# Patient Record
Sex: Female | Born: 1970
Health system: Southern US, Community
[De-identification: ages and names within clinical notes are randomized; demographics above are authoritative.]

## PROBLEM LIST (undated history)

## (undated) DIAGNOSIS — J302 Other seasonal allergic rhinitis: Secondary | ICD-10-CM

## (undated) DIAGNOSIS — T7840XA Allergy, unspecified, initial encounter: Secondary | ICD-10-CM

## (undated) DIAGNOSIS — J189 Pneumonia, unspecified organism: Secondary | ICD-10-CM

## (undated) DIAGNOSIS — J45909 Unspecified asthma, uncomplicated: Secondary | ICD-10-CM

## (undated) DIAGNOSIS — G43909 Migraine, unspecified, not intractable, without status migrainosus: Secondary | ICD-10-CM

## (undated) HISTORY — DX: Allergy, unspecified, initial encounter: T78.40XA

## (undated) HISTORY — DX: Other seasonal allergic rhinitis: J30.2

## (undated) HISTORY — DX: Unspecified asthma, uncomplicated: J45.909

## (undated) HISTORY — PX: APPENDECTOMY: SHX54

## (undated) HISTORY — DX: Migraine, unspecified, not intractable, without status migrainosus: G43.909

## (undated) HISTORY — PX: COLONOSCOPY: SHX174

---

## 2006-07-03 ENCOUNTER — Other Ambulatory Visit: Admission: RE | Admit: 2006-07-03 | Discharge: 2006-07-03 | Payer: Self-pay | Admitting: *Deleted

## 2011-04-14 ENCOUNTER — Emergency Department (INDEPENDENT_AMBULATORY_CARE_PROVIDER_SITE_OTHER)
Admission: EM | Admit: 2011-04-14 | Discharge: 2011-04-14 | Disposition: A | Discharge status: Home / Self Care | Payer: No Typology Code available for payment source | Source: Home / Self Care | Attending: Family Medicine | Admitting: Family Medicine

## 2011-04-14 ENCOUNTER — Emergency Department (INDEPENDENT_AMBULATORY_CARE_PROVIDER_SITE_OTHER): Payer: No Typology Code available for payment source

## 2011-04-14 DIAGNOSIS — S92502A Displaced unspecified fracture of left lesser toe(s), initial encounter for closed fracture: Secondary | ICD-10-CM

## 2011-04-14 DIAGNOSIS — S92919A Unspecified fracture of unspecified toe(s), initial encounter for closed fracture: Secondary | ICD-10-CM

## 2011-04-14 DIAGNOSIS — S838X9A Sprain of other specified parts of unspecified knee, initial encounter: Secondary | ICD-10-CM

## 2011-04-14 NOTE — ED Notes (Signed)
Kicked suitcase;  Left 4th toe pain w bruising

## 2011-04-14 NOTE — ED Provider Notes (Signed)
History     CSN: [id number] Arrival date & time: No admission date for patient encounter.   First MD Initiated Contact with Patient 04/14/11 1033      No chief complaint on file.   (Consider location/radiation/quality/duration/timing/severity/associated sxs/prior treatment) Patient is a 40 y.o. female presenting with foot injury.  Foot Injury  The incident occurred yesterday. The incident occurred at home. The injury mechanism was a direct blow. The pain is present in the left foot. The quality of the pain is described as throbbing. The pain is mild. The pain has been constant since onset. She reports no foreign bodies present. The symptoms are aggravated by palpation.    No past medical history on file.  No past surgical history on file.  No family history on file.  History  Substance Use Topics  . Smoking status: Not on file  . Smokeless tobacco: Not on file  . Alcohol Use: Not on file    OB History    No data available      Review of Systems  Musculoskeletal: Positive for gait problem.    Allergies  Review of patient's allergies indicates not on file.  Home Medications  No current outpatient prescriptions on file.  LMP 03/30/2011  Physical Exam  Constitutional: She appears well-developed and well-nourished.  Musculoskeletal: She exhibits edema and tenderness.       Feet:    ED Course  Procedures (including critical care time)  Labs Reviewed - No data to display Dg Foot Complete Left  04/14/2011  *RADIOLOGY REPORT*  Clinical Data: Foot injury 1 day ago.  Pain and lateral foot.  LEFT FOOT - COMPLETE 3+ VIEW  Comparison: None.  Findings: Fracture of the proximal phalanx of the fourth toe extends from the lateral mid shaft to the medial distal metaphysis. The fracture is relatively nondisplaced.  Soft tissue swelling is present lateral to the fifth metatarsophalangeal joint.  No malalignment at the Lisfranc joint is observed.  No metatarsal fracture is  noted.  IMPRESSION:  1.  Nondisplaced fracture of the proximal phalanx of the fourth toe. 2.  Soft tissue swelling lateral to the fifth metatarsal phalangeal joint.  Original Report Authenticated By: Dellia Cloud, M.D.     No diagnosis found.    MDM  X-rays reviewed and report per radiologist.         Barkley Bruns, MD 04/14/11 [id number]

## 2012-02-22 ENCOUNTER — Ambulatory Visit: Payer: No Typology Code available for payment source | Admitting: Family Medicine

## 2012-02-22 ENCOUNTER — Ambulatory Visit: Payer: No Typology Code available for payment source

## 2012-02-22 VITALS — BP 122/60 | HR 70 | Temp 98.4°F | Resp 16 | Ht 67.0 in | Wt 145.8 lb

## 2012-02-22 DIAGNOSIS — I493 Ventricular premature depolarization: Secondary | ICD-10-CM

## 2012-02-22 DIAGNOSIS — I491 Atrial premature depolarization: Secondary | ICD-10-CM

## 2012-02-22 LAB — POCT CBC
Granulocyte percent: 65.5 %G (ref 37–80)
MCV: 91.1 fL (ref 80–97)
MID (cbc): 0.6 (ref 0–0.9)
MPV: 8.8 fL (ref 0–99.8)
Platelet Count, POC: 370 10*3/uL (ref 142–424)
RBC: 4.86 M/uL (ref 4.04–5.48)

## 2012-02-22 LAB — COMPREHENSIVE METABOLIC PANEL
ALT: 13 U/L (ref 0–35)
AST: 16 U/L (ref 0–37)
Albumin: 4.9 g/dL (ref 3.5–5.2)
BUN: 13 mg/dL (ref 6–23)
Calcium: 10.3 mg/dL (ref 8.4–10.5)
Chloride: 103 mEq/L (ref 96–112)
Potassium: 4.3 mEq/L (ref 3.5–5.3)
Sodium: 136 mEq/L (ref 135–145)
Total Protein: 7.3 g/dL (ref 6.0–8.3)

## 2012-02-22 NOTE — Progress Notes (Signed)
Subjective:    Patient ID: Amy Odonnell, female    DOB: Apr 10, 1971, 41 y.o.   MRN: [id number]  HPI  Amy Odonnell is a 41 yr old female with a 2.5 week history of "frequent PVCs".  She believes she is having upwards of 100 per day.  She does not describe chest pain, but describes discomfort in her chest. She notices the irregular beats about mid-morning every day, and they get progressively worse.  Eating seems to make them worse.  She knows that stress makes this worse as well.  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  She is currently working on a PhD in Nurse, mental health at [employer].  She is in her fifth year and is waiting on data.  She is a single mom to a toddler and feels stressed about money.  She describes a similar experience when she was at [employer] doing her master's.  At that time she experienced frequent runs of tachycardia.  She had an echo which she reports revealed trivial TR but was otherwise normal.  She denies any associated symptoms.  She does not consume caffeinated products.  No shortness of breath, diaphoresis, nausea, chest pain, or arm pain.      Review of Systems  Constitutional: Negative for fever, chills, diaphoresis, activity change, appetite change and fatigue.  HENT: Negative.   Respiratory: Negative for cough, chest tightness and shortness of breath.   Cardiovascular: Positive for palpitations. Negative for chest pain and leg swelling.  Gastrointestinal: Negative.   Neurological: Negative for dizziness, syncope, light-headedness and headaches.       Objective:   Physical Exam  Constitutional: She is oriented to person, place, and time. She appears well-developed and well-nourished. No distress.  Cardiovascular: Normal rate, S1 normal, S2 normal and intact distal pulses.   Occasional extrasystoles are present. Exam reveals no gallop and no friction rub.   No murmur heard. Pulmonary/Chest: Effort normal and breath sounds normal. Not tachypneic.   Musculoskeletal: She exhibits no edema.  Neurological: She is alert and oriented to person, place, and time.  Skin: Skin is warm and dry.     Results for orders placed in visit on 02/22/12  POCT CBC      Component Value Range   WBC 9.7  4.6 - 10.2 K/uL   Lymph, poc 2.8  0.6 - 3.4   POC LYMPH PERCENT 28.8  10 - 50 %L   MID (cbc) 0.6  0 - 0.9   POC MID % 5.7  0 - 12 %M   POC Granulocyte 6.4  2 - 6.9   Granulocyte percent 65.5  37 - 80 %G   RBC 4.86  4.04 - 5.48 M/uL   Hemoglobin 14.2  12.2 - 16.2 g/dL   HCT, POC 30.8  65.7 - 47.9 %   MCV 91.1  80 - 97 fL   MCH, POC 29.2  27 - 31.2 pg   MCHC 32.1  31.8 - 35.4 g/dL   RDW, POC 84.6     Platelet Count, POC 370  142 - 424 K/uL   MPV 8.8  0 - 99.8 fL    EKG reviewed with Dr. Katrinka Blazing - frequent PACs    Assessment & Plan:  Amy Odonnell is a 41 yr old female presenting with several weeks of irregular heart beat.  EKG revealed several PACs, these are likely benign and related to stress and fatigue.  I encouraged her to continue avoiding caffeinated foods and beverages.  Encouraged adequate rest and stress reduction as much as possible.  CBC done in house did not reveal any abnormalities.  TSH and CMet are pending, and we will make adjustments based on those results if necessary.  She would like to establish care here and would like a wellness appointment in the next several weeks, at which point we can follow up on her PACs.  If they are still bothersome in the next several weeks, we can consider adding a beta blocker.

## 2012-02-22 NOTE — Patient Instructions (Signed)
Premature Beats A premature beat is an extra heartbeat that happens earlier than normal. Premature beats are called premature atrial contractions (PACs) or premature ventricular contractions (PVCs) depending on the area of the heart where they start. CAUSES  Premature beats may be brought on by a variety of factors including:  Emotional stress.  Lack of sleep.  Caffeine.  Asthma medicines.  Stimulants.  Herbal teas.  Dietary supplements.  Alcohol. In most cases, premature beats are not dangerous and are not a sign of serious heart disease. Most patients evaluated for premature beats have completely normal heart function. Rarely, premature beats may be a sign of more significant heart problems or medical illness. SYMPTOMS  Premature beats may cause palpitations. This means you feel like your heart is skipping a beat or beating harder than usual. Sometimes, slight chest pain occurs with premature beats, lasting only a few seconds. This pain has been described as a "flopping" feeling inside the chest. In many cases, premature beats do not cause any symptoms and they are only detected when an electrocardiography test (EKG) or heart monitoring is performed. DIAGNOSIS  Your caregiver may run some tests to evaluate your heart such as an EKG or echocardiography. You may need to wear a portable heart monitor for several days to record the electrical activity of your heart. Blood testing may also be performed to check your electrolytes and thyroid function. TREATMENT  Premature beats usually go away with rest. If the problem continues, your caregiver will determine a treatment plan for you.  HOME CARE INSTRUCTIONS  Get plenty of rest over the next few days until your symptoms improve.  Avoid coffee, tea, alcohol, and soda (pop, cola).  Do not smoke. SEEK MEDICAL CARE IF:  Your symptoms continue after 1 to 2 days of rest.  You have new symptoms, such as chest pain or trouble  breathing. SEEK IMMEDIATE MEDICAL CARE IF:  You have severe chest pain or abdominal pain.  You have pain that radiates into the neck, arm, or jaw.  You faint or have extreme weakness.  You have shortness of breath.  Your heartbeat races for more than 5 seconds. MAKE SURE YOU:  Understand these instructions.  Will watch your condition.  Will get help right away if you are not doing well or get worse. Document Released: 06/15/2004 Document Revised: 07/31/2011 Document Reviewed: 01/09/2011 ExitCare Patient Information 2013 [employer].  

## 2012-02-29 NOTE — Progress Notes (Signed)
I have reviewed HPI, physical exam, EKG in detail with Katha Hamming, PA-C.  Agree with assessment and plan.  KMS

## 2012-03-01 NOTE — Progress Notes (Signed)
Reviewed and agree.

## 2012-05-10 ENCOUNTER — Encounter: Payer: Self-pay | Admitting: Family Medicine

## 2012-05-10 ENCOUNTER — Ambulatory Visit (INDEPENDENT_AMBULATORY_CARE_PROVIDER_SITE_OTHER): Payer: No Typology Code available for payment source | Admitting: Family Medicine

## 2012-05-10 VITALS — BP 101/63 | HR 66 | Temp 98.5°F | Resp 16 | Ht 67.0 in | Wt 142.0 lb

## 2012-05-10 DIAGNOSIS — Z0000 Encounter for general adult medical examination without abnormal findings: Secondary | ICD-10-CM

## 2012-05-10 LAB — LIPID PANEL: LDL Cholesterol: 87 mg/dL (ref 0–99)

## 2012-05-10 NOTE — Progress Notes (Signed)
Subjective:    Patient ID: Amy Odonnell, female    DOB: 1970/08/10, 41 y.o.   MRN: [id number] Chief Complaint  Patient presents with  . Annual Exam    HPI  Amy Odonnell is a pleasant 41 yo female in for her health maintenance exam.  Amy Odonnell has an adopted 40 yo African-American daughter and is currently in the process of adopting another newborn! She has an adoption physical form today needing completion.  Had 100s of PACs daily for month - see last OV. She later realized that she was really stressed out and as soon as she removed some of the stressors, they completely resolved.    Older aunt with breast cancer but no other significant FMHx.    No h/o abnml pap smears.  Last pap smear was 3 yrs ago - in 2009, 2010, both normal. No sex in the past 5 yrs. Immunizations have to be UTD for work. Had tetanus done in 2004.  fasting  Past Medical History  Diagnosis Date  . Allergy   . Asthma   . Migraine    Past Surgical History  Procedure Date  . Appendectomy    Family History  Problem Relation Age of Onset  . Asthma Mother   . Hypothyroidism Mother   . Diabetes Maternal Grandmother   . Hypertension Maternal Grandmother    History  Substance Use Topics  . Smoking status: Never Smoker   . Smokeless tobacco: Not on file  . 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 No current outpatient prescriptions on file prior to visit.   Allergies  Allergen Reactions  . Codeine Hives   Review of Systems  Constitutional: Negative.   HENT: Negative.   Eyes: Negative.   Respiratory: Negative.   Cardiovascular: Negative.   Gastrointestinal: Negative.   Genitourinary: Negative.   Musculoskeletal: Negative.   Skin: Negative.   Neurological: Negative.    Hematological: Negative.   Psychiatric/Behavioral: Negative.       BP 101/63  Pulse 66  Temp 98.5 F (36.9 C) (Oral)  Resp 16  Ht 5\' 7"  (1.702 m)  Wt 142 lb (64.411 kg)  BMI 22.24 kg/m2  SpO2 99%  LMP 04/22/2012 Objective:   Physical Exam  Constitutional: She is oriented to person, place, and time. She appears well-developed and well-nourished. No distress.  HENT:  Head: Normocephalic and atraumatic.  Right Ear: Tympanic membrane, external ear and ear canal normal.  Left Ear: Tympanic membrane, external ear and ear canal normal.  Nose: Nose normal. No mucosal edema or rhinorrhea.  Mouth/Throat: Uvula is midline, oropharynx is clear and moist and mucous membranes are normal. No posterior oropharyngeal erythema.  Eyes: Conjunctivae normal and EOM are normal. Pupils are equal, round, and reactive to light. Right eye exhibits no discharge. Left eye exhibits no discharge. No scleral icterus.  Neck: Normal range of motion. Neck supple. No thyromegaly present.  Cardiovascular: Normal rate, regular rhythm, normal heart sounds and intact distal pulses.   Pulmonary/Chest: Effort normal and breath sounds normal. No respiratory distress.  Abdominal: Soft. Bowel sounds are normal. There is no tenderness.  Genitourinary: Vagina normal and uterus normal. No breast swelling, tenderness, discharge or bleeding. Cervix exhibits no motion tenderness and no friability. Right adnexum displays no  mass and no tenderness. Left adnexum displays no mass and no tenderness.  Musculoskeletal: She exhibits no edema.  Lymphadenopathy:    She has no cervical adenopathy.  Neurological: She is alert and oriented to person, place, and time. She has normal reflexes.  Skin: Skin is warm and dry. She is not diaphoretic. No erythema.  Psychiatric: She has a normal mood and affect. Her behavior is normal.      Assessment & Plan:   1. Routine general medical examination at a health care facility  Lipid panel, Pap IG  and HPV (high risk) DNA detection  Health Maintenance UTD. No concerns.  Needs TDaP at next OV

## 2012-05-13 LAB — PAP IG AND HPV HIGH-RISK: HPV DNA High Risk: NOT DETECTED

## 2012-07-06 ENCOUNTER — Other Ambulatory Visit: Payer: Self-pay

## 2012-07-12 ENCOUNTER — Encounter: Payer: Self-pay | Admitting: Family Medicine

## 2012-07-12 ENCOUNTER — Ambulatory Visit (INDEPENDENT_AMBULATORY_CARE_PROVIDER_SITE_OTHER): Payer: No Typology Code available for payment source | Admitting: Family Medicine

## 2012-07-12 VITALS — BP 94/68 | HR 62 | Temp 98.1°F | Resp 16 | Ht 67.0 in | Wt 145.5 lb

## 2012-07-12 DIAGNOSIS — B009 Herpesviral infection, unspecified: Secondary | ICD-10-CM

## 2012-07-12 MED ORDER — VALACYCLOVIR HCL 500 MG PO TABS: 500.0000 mg | ORAL_TABLET | Freq: Two times a day (BID) | ORAL | Status: DC

## 2012-07-12 MED ORDER — DROSPIRENONE-ETHINYL ESTRADIOL 3-0.02 MG PO TABS: 1.0000 | ORAL_TABLET | Freq: Every day | ORAL | Status: DC

## 2012-07-12 MED ORDER — DROSPIRENONE-ETHINYL ESTRADIOL 3-0.03 MG PO TABS: 1.0000 | ORAL_TABLET | Freq: Every day | ORAL | Status: DC

## 2012-07-12 NOTE — Progress Notes (Signed)
Subjective:    Patient ID: Amy Odonnell, female    DOB: Apr 12, 1971, 42 y.o.   MRN: [id number]  HPI  Amy Odonnell is doing very well. She is hoping to adopt a second child sometime in the next 4 to 8 mos. She has discussed her desire to breast feed with a lactation consultant who has had excellent experiencing achieving this goal in women who did not carry the pregnancy by placing people high OCPs with a high component of progesterone for about 5 mos and starting on a galactogue in addition like reglan.  Unfortunately, reglan - which pt prev tried during her first addoption made her very fatigued so she has started on otc domperadone. Had a 1 mo supply of Yasmin and is on the 3rd wk of it without any side effects.  She is aware of the increased DVT/PE risk with this med and is very active.  She is enjoying the benefit of being amenorrheic and not having cramping and painful periods. Her 42 yo Amy Odonnell is doing well. Past Medical History  Diagnosis Date  . Allergy   . Asthma   . Migraine    No current outpatient prescriptions on file prior to visit.   No current facility-administered medications on file prior to visit.   Allergies  Allergen Reactions  . Codeine Hives   Review of Systems  Constitutional: Negative for fever, chills, diaphoresis, activity change, appetite change, fatigue and unexpected weight change.  Respiratory: Negative for cough and shortness of breath.   Cardiovascular: Negative for chest pain, palpitations and leg swelling.  Genitourinary: Positive for menstrual problem. Negative for dysuria, flank pain, vaginal bleeding, vaginal discharge, enuresis, difficulty urinating and genital sores.  Musculoskeletal: Negative for myalgias, joint swelling and gait problem.  Neurological: Negative for dizziness, syncope, weakness, light-headedness and numbness.      BP 94/68  Pulse 62  Temp(Src) 98.1 F (36.7 C) (Oral)  Resp 16  Ht 5\' 7"  (1.702 m)  Wt 145 lb 8 oz (65.998 kg)  BMI  22.78 kg/m2  SpO2 100%  LMP 06/10/2012 Objective:   Physical Exam  Constitutional: She is oriented to person, place, and time. She appears well-developed and well-nourished. No distress.  HENT:  Head: Normocephalic and atraumatic.  Right Ear: External ear normal.  Eyes: Conjunctivae are normal. No scleral icterus.  Pulmonary/Chest: Effort normal.  Neurological: She is alert and oriented to person, place, and time.  Skin: Skin is warm and dry. She is not diaphoretic. No erythema.  Psychiatric: She has a normal mood and affect. Her behavior is normal.          Assessment & Plan:  Dysmenorrhea - start back-to-back pill packs of yasmin - skip placebo pills so med is continuous. Minimize other dvt risk factors.  Herpes labialis - Plan: valACYclovir (VALTREX) 500 MG tablet - takes as needed for rare breakouts but like to keep at home so can take immed if needed.  Need for Tdap vaccination - Plan: Tdap vaccine greater than or equal to 7yo IM  Meds ordered this encounter  Medications                        . valACYclovir (VALTREX) 500 MG tablet    Sig: Take 1 tablet (500 mg total) by mouth 2 (two) times daily.    Dispense:  60 tablet    Refill:  5  . drospirenone-ethinyl estradiol (YASMIN 28) 3-0.03 MG tablet    Sig: Take 1 tablet by  mouth daily. Skip placebo pills. Start a new pill pack every 21 days so you are only taking active pills.    Dispense:  3 Package    Refill:  3    D/c prev order for [employer]

## 2013-03-27 ENCOUNTER — Other Ambulatory Visit: Payer: Self-pay

## 2013-05-27 ENCOUNTER — Encounter: Payer: No Typology Code available for payment source | Admitting: Family Medicine

## 2013-06-27 ENCOUNTER — Encounter: Payer: Self-pay | Admitting: Family Medicine

## 2013-06-27 ENCOUNTER — Ambulatory Visit (INDEPENDENT_AMBULATORY_CARE_PROVIDER_SITE_OTHER): Payer: BC Managed Care – PPO | Admitting: Family Medicine

## 2013-06-27 VITALS — BP 116/66 | HR 73 | Temp 98.7°F | Resp 16 | Ht 67.0 in | Wt 145.8 lb

## 2013-06-27 DIAGNOSIS — Z0000 Encounter for general adult medical examination without abnormal findings: Secondary | ICD-10-CM

## 2013-06-27 DIAGNOSIS — D239 Other benign neoplasm of skin, unspecified: Secondary | ICD-10-CM

## 2013-06-27 DIAGNOSIS — D229 Melanocytic nevi, unspecified: Secondary | ICD-10-CM

## 2013-06-27 DIAGNOSIS — L989 Disorder of the skin and subcutaneous tissue, unspecified: Secondary | ICD-10-CM

## 2013-06-27 NOTE — Progress Notes (Signed)
Chief Complaint:  Chief Complaint  Patient presents with  . Annual Exam    HPI: Amy Odonnell is a 43 y.o. 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  I am not going to argue with her.  She wants 2 lesions to look at  She has 1 adopted child names Amy Odonnell and is in the process of gadopting another child   Past Medical History  Diagnosis Date  . Allergy   . Asthma   . Migraine    Past Surgical History  Procedure Laterality Date  . Appendectomy     History   Social History  . Marital Status: Unknown    Spouse Name: N/A    Number of Children: N/A  . Years of Education: N/A   Social History Main Topics  . Smoking status: Never Smoker   . Smokeless tobacco: None  . Alcohol Use: No  . Drug Use: No  . Sexual Activity: Yes   Other Topics Concern  . None   Social History Narrative  . None   Family History  Problem Relation Age of Onset  . Asthma Mother   . Hypothyroidism Mother   . Diabetes Maternal Grandmother   . Hypertension Maternal Grandmother   . Hyperlipidemia Father    Allergies  Allergen Reactions  . Codeine Hives   Prior to Admission medications   Medication Sig Start Date End Date Taking? Authorizing Provider  ALBUTEROL IN Inhale into the lungs as needed.   Yes Historical Provider, MD  valACYclovir (VALTREX) 500 MG tablet Take 1 tablet (500 mg total) by mouth 2 (two) times daily. 07/12/12  Yes Shawnee Knapp, MD  drospirenone-ethinyl estradiol (YASMIN 28) 3-0.03 MG tablet Take 1 tablet by mouth daily. Skip placebo pills. Start a new pill pack every 21 days so you are only  taking active pills. 07/12/12   Shawnee Knapp, MD     ROS: The patient denies fevers, chills, night sweats, unintentional weight loss, chest pain, palpitations, wheezing, dyspnea on exertion, nausea, vomiting, abdominal pain, dysuria, hematuria, melena, numbness, weakness, or tingling.   All other systems have been reviewed and were otherwise negative with the exception of those mentioned in the HPI and as above.    PHYSICAL EXAM: Filed Vitals:   06/27/13 1021  BP: 116/66  Pulse: 73  Temp: 98.7 F (37.1 C)  Resp: 16   Filed Vitals:   06/27/13 1021  Height: 5\' 7"  (1.702 m)  Weight: 145 lb 12.8 oz (66.134 kg)   Body mass index is 22.83 kg/(m^2).  General: Alert, no acute distress HEENT:  Normocephalic, atraumatic, oropharynx patent. EOMI, PERRLA Cardiovascular:  Regular rate and rhythm, no rubs murmurs or gallops.  No Carotid bruits, radial pulse intact. No pedal edema.  Respiratory: Clear to auscultation bilaterally.  No wheezes, rales, or rhonchi.  No cyanosis, no use of accessory musculature GI: No organomegaly, abdomen is soft and non-tender, positive bowel sounds.  No masses. Skin: + nevus, benign in appearance with SK  Neurologic: Facial musculature symmetric. Psychiatric: Patient is appropriate  throughout our interaction. Lymphatic: No cervical lymphadenopathy Musculoskeletal: Gait intact. Breast exam normal, no sxs Declined pelvic exam Not sexually exam   LABS: Results for orders placed in visit on 05/10/12  LIPID PANEL      Result Value Range   Cholesterol 165  0 - 200 mg/dL   Triglycerides 44  <150 mg/dL   HDL 69  >39 mg/dL   Total CHOL/HDL Ratio 2.4     VLDL 9  0 - 40 mg/dL   LDL Cholesterol 87  0 - 99 mg/dL  PAP IG AND HPV HIGH-RISK      Result Value Range   HPV DNA High Risk NOT DETECTED     Specimen adequacy:       FINAL DIAGNOSIS:       COMMENTS:       Cytotechnologist:         EKG/XRAY:   Primary read interpreted by Dr. Marin Comment at  [facility].   ASSESSMENT/PLAN: Encounter Diagnoses  Name Primary?  . Annual physical exam Yes  . Benign skin lesion   . Nevus    Verbal consent obtained, 2 ml of 1% lidocaine with epi used and bedadine and alcohol and ethylchloride used for local sedation and cleaning of area. 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    Gross sideeffects, risk and benefits, and alternatives of medications d/w patient. Patient is aware that all medications have potential sideeffects and we are unable to predict every sideeffect or drug-drug interaction that may occur.  LE, [place], DO 06/27/2013 11:08 AM

## 2013-09-05 ENCOUNTER — Encounter: Payer: Self-pay | Admitting: Family Medicine

## 2013-09-05 ENCOUNTER — Ambulatory Visit (INDEPENDENT_AMBULATORY_CARE_PROVIDER_SITE_OTHER): Payer: BC Managed Care – PPO | Admitting: Family Medicine

## 2013-09-05 VITALS — BP 105/66 | HR 74 | Temp 98.3°F | Resp 16 | Ht 67.25 in | Wt 146.0 lb

## 2013-09-05 DIAGNOSIS — F419 Anxiety disorder, unspecified: Secondary | ICD-10-CM

## 2013-09-05 DIAGNOSIS — F5105 Insomnia due to other mental disorder: Principal | ICD-10-CM

## 2013-09-05 DIAGNOSIS — F411 Generalized anxiety disorder: Secondary | ICD-10-CM

## 2013-09-05 DIAGNOSIS — F489 Nonpsychotic mental disorder, unspecified: Secondary | ICD-10-CM

## 2013-09-05 MED ORDER — TRAZODONE HCL 50 MG PO TABS: 50.0000 mg | ORAL_TABLET | Freq: Every day | ORAL | Status: DC

## 2013-09-05 NOTE — Patient Instructions (Signed)

## 2013-09-06 DIAGNOSIS — Z3041 Encounter for surveillance of contraceptive pills: Secondary | ICD-10-CM | POA: Insufficient documentation

## 2013-09-06 NOTE — Progress Notes (Signed)
S:  This 43 y.o. [place] female has insomnia >> 12 months duration. SHe adopted an infant girl 4 years ago; her sleep pattern never returned to normal even after the child began to sleep through the night. She is now in the process of adopting another child; the birth mother is in her 75th week of the pregnancy. The pregnancy is going well but the pt has concerns because of the birth mother's hx of substance abuse. 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 She works at [employer] and the physicians she works w/ have recommended she try Trazodone. OTC melatonin ineffective; Benadryl had side effects. She would like to use a prescribed medication just until she has new infant in the home and everything has settled down a little. She is certain she does not want to use Ambien because she has a young child in the home.  PMHx, Surg Hx, Soc and Fam Hx reviewed.  ROS; Noncontributory.  O: Filed Vitals:   09/05/13 1614  BP: 105/66  Pulse: 74  Temp: 98.3 F (36.8 C)  Resp: 16   GEN: In NAD: WN,WD. HENT: [place]/AT; EOMI w/ clear conj/sclerae. Otherwise unremarkable. COR: RRR. LUNGS: Unlabored resp. SKIN: W&D; intact w/o diaphoresis, erythema, jaundice or pallor. EXT: MAEs; no c/c/e. NEURO: A&O x 3; CNs intact. Nonfocal. PSYCH: Pleasant and calm. Conversant and articulate. Speech pattern and thought content normal. Judgement sound.  A/P: Insomnia secondary to anxiety- Advised Chamomile tea at bedtime and establish a regular bedtime routine. Trial Trazodone 50 mg 1/2 tablet hs. If not sleeping through the night, increase to 1 tablet.  Meds ordered this encounter  Medications  . traZODone (DESYREL) 50 MG tablet    Sig: Take 1 tablet (50 mg total) by mouth at bedtime.    Dispense:  30 tablet    Refill:  3

## 2015-08-23 ENCOUNTER — Ambulatory Visit (INDEPENDENT_AMBULATORY_CARE_PROVIDER_SITE_OTHER): Payer: 59 | Admitting: Urgent Care

## 2015-08-23 VITALS — BP 122/74 | HR 77 | Temp 98.0°F | Resp 16 | Ht 67.5 in | Wt 154.0 lb

## 2015-08-23 DIAGNOSIS — J189 Pneumonia, unspecified organism: Secondary | ICD-10-CM

## 2015-08-23 DIAGNOSIS — B001 Herpesviral vesicular dermatitis: Secondary | ICD-10-CM | POA: Diagnosis not present

## 2015-08-23 DIAGNOSIS — J4530 Mild persistent asthma, uncomplicated: Secondary | ICD-10-CM | POA: Diagnosis not present

## 2015-08-23 MED ORDER — VALACYCLOVIR HCL 500 MG PO TABS: 500.0000 mg | ORAL_TABLET | Freq: Two times a day (BID) | ORAL | Status: DC

## 2015-08-23 MED ORDER — ALBUTEROL SULFATE HFA 108 (90 BASE) MCG/ACT IN AERS: 2.0000 | INHALATION_SPRAY | Freq: Four times a day (QID) | RESPIRATORY_TRACT | Status: DC | PRN

## 2015-08-23 MED ORDER — PREDNISONE 20 MG PO TABS: ORAL_TABLET | ORAL | Status: DC

## 2015-08-23 MED ORDER — AZITHROMYCIN 250 MG PO TABS: ORAL_TABLET | ORAL | Status: DC

## 2015-08-23 NOTE — Patient Instructions (Addendum)
Community-Acquired Pneumonia, Adult Pneumonia is an infection of the lungs. There are different types of pneumonia. One type can develop while a person is in a hospital. A different type, called community-acquired pneumonia, develops in people who are not, or have not recently been, in the hospital or other health care facility.  CAUSES Pneumonia may be caused by bacteria, viruses, or funguses. Community-acquired pneumonia is often caused by Streptococcus pneumonia bacteria. These bacteria are often passed from one person to another by breathing in droplets from the cough or sneeze of an infected person. RISK FACTORS The condition is more likely to develop in:  People who havechronic diseases, such as chronic obstructive pulmonary disease (COPD), asthma, congestive heart failure, cystic fibrosis, diabetes, or kidney disease.  People who haveearly-stage or late-stage HIV.  People who havesickle cell disease.  People who havehad their spleen removed (splenectomy).  People who havepoor Human resources officer.  People who havemedical conditions that increase the risk of breathing in (aspirating) secretions their own mouth and nose.   People who havea weakened immune system (immunocompromised).  People who smoke.  People whotravel to areas where pneumonia-causing germs commonly exist.  People whoare around animal habitats or animals that have pneumonia-causing germs, including birds, bats, rabbits, cats, and farm animals. SYMPTOMS Symptoms of this condition include:  Adry cough.  A wet (productive) cough.  Fever.  Sweating.  Chest pain, especially when breathing deeply or coughing.  Rapid breathing or difficulty breathing.  Shortness of breath.  Shaking chills.  Fatigue.  Muscle aches. DIAGNOSIS Your health care provider will take a medical history and perform a physical exam. You may also have other tests, including:  Imaging studies of your chest, including  X-rays.  Tests to check your blood oxygen level and other blood gases.  Other tests on blood, mucus (sputum), fluid around your lungs (pleural fluid), and urine. If your pneumonia is severe, other tests may be done to identify the specific cause of your illness. TREATMENT The type of treatment that you receive depends on many factors, such as the cause of your pneumonia, the medicines you take, and other medical conditions that you have. For most adults, treatment and recovery from pneumonia may occur at home. In some cases, treatment must happen in a hospital. Treatment may include:  Antibiotic medicines, if the pneumonia was caused by bacteria.  Antiviral medicines, if the pneumonia was caused by a virus.  Medicines that are given by mouth or through an IV tube.  Oxygen.  Respiratory therapy. Although rare, treating severe pneumonia may include:  Mechanical ventilation. This is done if you are not breathing well on your own and you cannot maintain a safe blood oxygen level.  Thoracentesis. This procedureremoves fluid around one lung or both lungs to help you breathe better. HOME CARE INSTRUCTIONS  Take over-the-counter and prescription medicines only as told by your health care provider.  Only takecough medicine if you are losing sleep. Understand that cough medicine can prevent your body's natural ability to remove mucus from your lungs.  If you were prescribed an antibiotic medicine, take it as told by your health care provider. Do not stop taking the antibiotic even if you start to feel better.  Sleep in a semi-upright position at night. Try sleeping in a reclining chair, or place a few pillows under your head.  Do not use tobacco products, including cigarettes, chewing tobacco, and e-cigarettes. If you need help quitting, ask your health care provider.  Drink enough water to keep your urine  clear or pale yellow. This will help to thin out mucus secretions in your  lungs. PREVENTION There are ways that you can decrease your risk of developing community-acquired pneumonia. Consider getting a pneumococcal vaccine if:  You are older than 44 years of age.  You are older than 44 years of age and are undergoing cancer treatment, have chronic lung disease, or have other medical conditions that affect your immune system. Ask your health care provider if this applies to you. There are different types and schedules of pneumococcal vaccines. Ask your health care provider which vaccination option is best for you. You may also prevent community-acquired pneumonia if you take these actions:  Get an influenza vaccine every year. Ask your health care provider which type of influenza vaccine is best for you.  Go to the dentist on a regular basis.  Wash your hands often. Use hand sanitizer if soap and water are not available. SEEK MEDICAL CARE IF:  You have a fever.  You are losing sleep because you cannot control your cough with cough medicine. SEEK IMMEDIATE MEDICAL CARE IF:  You have worsening shortness of breath.  You have increased chest pain.  Your sickness becomes worse, especially if you are an older adult or have a weakened immune system.  You cough up blood.   This information is not intended to replace advice given to you by your health care provider. Make sure you discuss any questions you have with your health care provider.   Document Released: 05/08/2005 Document Revised: 01/27/2015 Document Reviewed: 09/02/2014 Elsevier Interactive Patient Education 2016 [employer].     IF you received an x-ray today, you will receive an invoice from [facility]. Please contact [facility] at [phone] with questions or concerns regarding your invoice.   IF you received labwork today, you will receive an invoice from [employer]. Please contact [facility] at [phone] with questions or concerns regarding  your invoice.   Our billing staff will not be able to assist you with questions regarding bills from these companies.  You will be contacted with the lab results as soon as they are available. The fastest way to get your results is to activate your My Chart account. Instructions are located on the last page of this paperwork. If you have not heard from [place] regarding the results in 2 weeks, please contact this office.

## 2015-08-23 NOTE — Progress Notes (Signed)
    MRN: [id number] DOB: 06-04-1970  Subjective:   Amy Odonnell is a 45 y.o. female presenting for chief complaint of Cough; Nasal Congestion; and Medication Refill  Reports 1 week history of worsening cough, wheezing, shob, now having chest pain with her cough. Patient had a very difficult time last night, states that she used her son's inhaler and took some prednisone her family had left over from her dog. Denies fever, n/v, abdominal pain, sore throat. She is not a smoker and rarely uses her own inhaler.    Hikma has a current medication list which includes the following prescription(s): albuterol, drospirenone-ethinyl estradiol, trazodone, and valacyclovir. Also is allergic to codeine.  Amy Odonnell  has a past medical history of Allergy; Asthma; and Migraine. Also  has past surgical history that includes Appendectomy.  Objective:   Vitals: BP 122/74 mmHg  Pulse 77  Temp(Src) 98 F (36.7 C)  Resp 16  Ht 5' 7.5" (1.715 m)  Wt 154 lb (69.854 kg)  BMI 23.75 kg/m2  SpO2 95%  Physical Exam  Constitutional: She is oriented to person, place, and time. She appears well-developed and well-nourished.  HENT:  Mouth/Throat: Oropharynx is clear and moist.  Eyes: No scleral icterus.  Cardiovascular: Normal rate, regular rhythm and intact distal pulses.  Exam reveals no gallop and no friction rub.   No murmur heard. Pulmonary/Chest: No respiratory distress. She has no wheezes. She has rales (bilateral R>L).  Neurological: She is alert and oriented to person, place, and time.  Skin: Skin is warm and dry.   Assessment and Plan :   1. CAP (community acquired pneumonia) 2. Extrinsic asthma, mild persistent, uncomplicated - Will cover patient for infectious process. Patient declined x-ray, nebulizer treatment. I advised her not to use medications that are not prescribed to her. 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 I suggested patient schedule her albuterol  inhaler and also not let her son use the one she took from him. She agreed. RTC in 1 week if no improvement.  3. Herpes labialis - Patient requested refill of her cold sore medication.  Amy Eagles, PA-C [facility] [phone] 08/23/2015 10:55 AM

## 2016-02-05 DIAGNOSIS — H524 Presbyopia: Secondary | ICD-10-CM | POA: Diagnosis not present

## 2016-02-05 DIAGNOSIS — H5213 Myopia, bilateral: Secondary | ICD-10-CM | POA: Diagnosis not present

## 2016-02-05 DIAGNOSIS — H52223 Regular astigmatism, bilateral: Secondary | ICD-10-CM | POA: Diagnosis not present

## 2016-05-03 ENCOUNTER — Telehealth: Payer: 59 | Admitting: Family

## 2016-05-03 DIAGNOSIS — J019 Acute sinusitis, unspecified: Secondary | ICD-10-CM | POA: Diagnosis not present

## 2016-05-03 MED ORDER — AMOXICILLIN-POT CLAVULANATE 875-125 MG PO TABS: 1.0000 | ORAL_TABLET | Freq: Two times a day (BID) | ORAL | 0 refills | Status: DC

## 2016-05-03 NOTE — Progress Notes (Signed)

## 2016-12-18 ENCOUNTER — Telehealth: Payer: 59 | Admitting: Family

## 2016-12-18 DIAGNOSIS — J019 Acute sinusitis, unspecified: Secondary | ICD-10-CM

## 2016-12-18 DIAGNOSIS — B9689 Other specified bacterial agents as the cause of diseases classified elsewhere: Secondary | ICD-10-CM

## 2016-12-18 MED ORDER — AMOXICILLIN-POT CLAVULANATE 875-125 MG PO TABS: 1.0000 | ORAL_TABLET | Freq: Two times a day (BID) | ORAL | 0 refills | Status: DC

## 2016-12-18 MED FILL — AMOX-CLAV 875-125 MG TABLET: 875-125 | 7 days supply | Qty: 14 | Fill #0

## 2016-12-18 NOTE — Progress Notes (Signed)

## 2017-04-02 ENCOUNTER — Encounter: Payer: Self-pay | Admitting: Family Medicine

## 2017-04-02 ENCOUNTER — Ambulatory Visit (INDEPENDENT_AMBULATORY_CARE_PROVIDER_SITE_OTHER): Payer: 59 | Admitting: Family Medicine

## 2017-04-02 VITALS — BP 118/68 | HR 76 | Temp 98.4°F | Ht 67.5 in | Wt 175.0 lb

## 2017-04-02 DIAGNOSIS — Z0000 Encounter for general adult medical examination without abnormal findings: Secondary | ICD-10-CM

## 2017-04-02 DIAGNOSIS — Z13220 Encounter for screening for lipoid disorders: Secondary | ICD-10-CM

## 2017-04-02 LAB — CBC WITH DIFFERENTIAL/PLATELET
Basophils Absolute: 0 10*3/uL (ref 0.0–0.1)
Basophils Relative: 0.6 % (ref 0.0–3.0)
Eosinophils Absolute: 0.2 10*3/uL (ref 0.0–0.7)
Eosinophils Relative: 2.5 % (ref 0.0–5.0)
HCT: 40.6 % (ref 36.0–46.0)
Hemoglobin: 13.5 g/dL (ref 12.0–15.0)
Lymphocytes Relative: 25.9 % (ref 12.0–46.0)
Lymphs Abs: 2.1 10*3/uL (ref 0.7–4.0)
MCHC: 33.1 g/dL (ref 30.0–36.0)
MCV: 89.5 fl (ref 78.0–100.0)
Monocytes Absolute: 0.7 10*3/uL (ref 0.1–1.0)
Monocytes Relative: 8.5 % (ref 3.0–12.0)
Neutro Abs: 5 10*3/uL (ref 1.4–7.7)
Neutrophils Relative %: 62.5 % (ref 43.0–77.0)
Platelets: 365 10*3/uL (ref 150.0–400.0)
RBC: 4.54 Mil/uL (ref 3.87–5.11)
RDW: 13.6 % (ref 11.5–15.5)
WBC: 8 10*3/uL (ref 4.0–10.5)

## 2017-04-02 LAB — COMPREHENSIVE METABOLIC PANEL
ALT: 10 U/L (ref 0–35)
AST: 13 U/L (ref 0–37)
Albumin: 4.5 g/dL (ref 3.5–5.2)
Alkaline Phosphatase: 39 U/L (ref 39–117)
BUN: 13 mg/dL (ref 6–23)
CO2: 28 mEq/L (ref 19–32)
Calcium: 10.9 mg/dL — ABNORMAL HIGH (ref 8.4–10.5)
Chloride: 101 mEq/L (ref 96–112)
Creatinine, Ser: 0.78 mg/dL (ref 0.40–1.20)
GFR: 84.61 mL/min (ref >60.00)
Glucose, Bld: 96 mg/dL (ref 70–99)
Potassium: 4.7 mEq/L (ref 3.5–5.1)
Sodium: 138 mEq/L (ref 135–145)
Total Bilirubin: 0.7 mg/dL (ref 0.2–1.2)
Total Protein: 6.9 g/dL (ref 6.0–8.3)

## 2017-04-02 LAB — LIPID PANEL
Cholesterol: 206 mg/dL — ABNORMAL HIGH (ref 0–200)
HDL: 59.7 mg/dL (ref >39.00)
LDL Cholesterol: 132 mg/dL — ABNORMAL HIGH (ref 0–99)
NonHDL: 145.95
Total CHOL/HDL Ratio: 3
Triglycerides: 68 mg/dL (ref 0.0–149.0)
VLDL: 13.6 mg/dL (ref 0.0–40.0)

## 2017-04-02 NOTE — Progress Notes (Signed)
   Subjective:    Teigen Parslow is a 46 y.o. female and is here for a comprehensive physical exam.  Pertinent Gynecological History: Patient's last menstrual period was 03/19/2017. Sexually active: No Menses: regular every month without intermenstrual spotting Last mammogram: none, will start at age 59 Last pap: normal Date: ~ 3 years ago  PMHx, SurgHx, SocialHx, Medications, and Allergies were reviewed in the Visit Navigator and updated as appropriate.   Past Medical History:  Diagnosis Date  . Allergy   . Asthma   . Migraine    Past Surgical History:  Procedure Laterality Date  . APPENDECTOMY     Family History  Problem Relation Age of Onset  . Asthma Mother   . Hypothyroidism Mother   . Diabetes Maternal Grandmother   . Hypertension Maternal Grandmother   . Hyperlipidemia Father    Social History   Tobacco Use  . Smoking status: Never Smoker  . Smokeless tobacco: Never Used  Substance Use Topics  . Alcohol use: Yes    Comment: Social   . Drug use: No   Review of Systems:   Pertinent items are noted in the HPI. Otherwise, ROS is negative.  Objective:   BP 118/68   Pulse 76   Temp 98.4 F (36.9 C) (Oral)   Ht 5' 7.5" (1.715 m)   Wt 175 lb (79.4 kg)   LMP 03/19/2017   SpO2 98%   BMI 27.00 kg/m   Wt Readings from Last 3 Encounters:  04/02/17 175 lb (79.4 kg)  08/23/15 154 lb (69.9 kg)  09/05/13 146 lb (66.2 kg)     Ht Readings from Last 3 Encounters:  04/02/17 5' 7.5" (1.715 m)  08/23/15 5' 7.5" (1.715 m)  09/05/13 5' 7.25" (1.708 m)   General appearance: alert, cooperative and appears stated age. Head: normocephalic, without obvious abnormality, atraumatic. Neck: no adenopathy, supple, symmetrical, trachea midline; thyroid not enlarged, symmetric, no tenderness/mass/nodules. Lungs: clear to auscultation bilaterally. Heart: regular rate and rhythm Abdomen: soft, non-tender; no masses,  no organomegaly. Extremities: extremities normal,  atraumatic, no cyanosis or edema. Skin: skin color, texture, turgor normal, no rashes or lesions. Lymph: cervical, supraclavicular, and axillary nodes normal; no abnormal inguinal nodes palpated. Neurologic: grossly normal.  Assessment/Plan:   Diagnoses and all orders for this visit:  Routine physical examination -     CBC with Differential/Platelet -     Comprehensive metabolic panel  Screening for lipid disorders -     Lipid panel   Patient Counseling: [x]    Nutrition: Stressed importance of moderation in sodium/caffeine intake, saturated fat and cholesterol, caloric balance, sufficient intake of fresh fruits, vegetables, fiber, calcium, iron, and 1 mg of folate supplement per day (for females capable of pregnancy).  [x]    Stressed the importance of regular exercise.   [x]    Substance Abuse: Discussed cessation/primary prevention of tobacco, alcohol, or other drug use; driving or other dangerous activities under the influence; availability of treatment for abuse.   [x]    Injury prevention: Discussed safety belts, safety helmets, smoke detector, smoking near bedding or upholstery.   [x]    Sexuality: Discussed sexually transmitted diseases, partner selection, use of condoms, avoidance of unintended pregnancy  and contraceptive alternatives.  [x]    Dental health: Discussed importance of regular tooth brushing, flossing, and dental visits.  [x]    Health maintenance and immunizations reviewed. Please refer to Health maintenance section.   Briscoe Deutscher, DO [place]

## 2017-04-08 NOTE — Addendum Note (Signed)
Addended by: Briscoe Deutscher R on: 04/08/2017 07:23 PM   Modules accepted: Orders

## 2017-04-18 DIAGNOSIS — Z0100 Encounter for examination of eyes and vision without abnormal findings: Secondary | ICD-10-CM | POA: Diagnosis not present

## 2017-06-25 ENCOUNTER — Telehealth: Payer: Self-pay | Admitting: Family Medicine

## 2017-06-25 NOTE — Telephone Encounter (Signed)
Copied from [place] [id number]. Topic: General - Other >> Jun 25, 2017  3:55 PM Lolita Rieger, [place] wrote: Reason for CRM: pt would like a call back concerning her children becoming patients here at this practice please call pt at [phone] she would like to know why they would need a new pt appt if they are healthy and only need a CPE

## 2017-06-26 NOTE — Telephone Encounter (Signed)
Called and scheduled both children for July 11.

## 2017-09-12 ENCOUNTER — Ambulatory Visit: Payer: 59 | Admitting: Family Medicine

## 2017-09-19 ENCOUNTER — Encounter: Payer: Self-pay | Admitting: Family Medicine

## 2017-09-19 ENCOUNTER — Ambulatory Visit (INDEPENDENT_AMBULATORY_CARE_PROVIDER_SITE_OTHER): Payer: 59 | Admitting: Family Medicine

## 2017-09-19 DIAGNOSIS — J0100 Acute maxillary sinusitis, unspecified: Secondary | ICD-10-CM

## 2017-09-19 DIAGNOSIS — Z30011 Encounter for initial prescription of contraceptive pills: Secondary | ICD-10-CM | POA: Diagnosis not present

## 2017-09-19 DIAGNOSIS — B001 Herpesviral vesicular dermatitis: Secondary | ICD-10-CM | POA: Diagnosis not present

## 2017-09-19 DIAGNOSIS — N924 Excessive bleeding in the premenopausal period: Secondary | ICD-10-CM

## 2017-09-19 LAB — COMPREHENSIVE METABOLIC PANEL
ALT: 14 U/L (ref 0–35)
AST: 14 U/L (ref 0–37)
Albumin: 4.2 g/dL (ref 3.5–5.2)
Alkaline Phosphatase: 41 U/L (ref 39–117)
BUN: 15 mg/dL (ref 6–23)
CO2: 27 mEq/L (ref 19–32)
Calcium: 9.4 mg/dL (ref 8.4–10.5)
Chloride: 103 mEq/L (ref 96–112)
Creatinine, Ser: 0.74 mg/dL (ref 0.40–1.20)
GFR: 89.72 mL/min (ref >60.00)
Glucose, Bld: 98 mg/dL (ref 70–99)
Potassium: 4.3 mEq/L (ref 3.5–5.1)
Sodium: 138 mEq/L (ref 135–145)
Total Bilirubin: 0.5 mg/dL (ref 0.2–1.2)
Total Protein: 6.9 g/dL (ref 6.0–8.3)

## 2017-09-19 MED ORDER — AMOXICILLIN 875 MG PO TABS: 875.0000 mg | ORAL_TABLET | Freq: Two times a day (BID) | ORAL | 0 refills | Status: DC

## 2017-09-19 MED ORDER — NORGESTIMATE-ETH ESTRADIOL 0.25-35 MG-MCG PO TABS: 1.0000 | ORAL_TABLET | Freq: Every day | ORAL | 1 refills | Status: DC

## 2017-09-19 MED ORDER — VALACYCLOVIR HCL 1 G PO TABS: 1000.0000 mg | ORAL_TABLET | Freq: Two times a day (BID) | ORAL | 0 refills | Status: DC

## 2017-09-19 MED FILL — FEMYNOR 0.25-35 MG-MCG TABS: 0.25-35 | 84 days supply | Qty: 84 | Fill #0

## 2017-09-19 MED FILL — valACYclovir HCL 1 GM TABS: 1 | 10 days supply | Qty: 20 | Fill #0

## 2017-09-19 MED FILL — AMOXICILLIN 875 MG TABLET: 875 | 10 days supply | Qty: 20 | Fill #0

## 2017-09-19 NOTE — Progress Notes (Addendum)
Amy Odonnell is a 47 y.o. female is here for follow up.  History of Present Illness:   HPI: Patient presents for discussion of birth control pills.  She is 47 year old and read about pregnancy but he has noticed increased discomfort with her menses.  She feels that she is getting near to perimenopausal status.  She has done well on Yaz in the past.  Patient complains of sinus pain, pressure, congestion, and cough x1 to 2 weeks.  Started as allergies.  Seems to be worsening.  No fevers.  Non-smoker.  No history of asthma.  Recent labs with elevated calcium levels.  She went calcium supplements and stopped those.  Okay for recheck today.  Health Maintenance Due  Topic Date Due  . PAP SMEAR  05/23/2015   Depression screen [facility] 2/9 04/02/2017 08/23/2015  Decreased Interest 0 0  Down, Depressed, Hopeless 0 0  PHQ - 2 Score 0 0   PMHx, SurgHx, SocialHx, FamHx, Medications, and Allergies were reviewed in the Visit Navigator and updated as appropriate.   Patient Active Problem List   Diagnosis Date Noted  . 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 Smoking status: Never Smoker  . Smokeless tobacco: Never Used  Substance Use Topics  . Alcohol use: Yes    Comment: Social   . Drug use: No   Current Medications and Allergies:   .  valACYclovir (VALTREX) 1000 MG tablet, Take 1 tablet (1,000 mg total) by mouth 2 (two) times daily., Disp: 20 tablet, Rfl: 0   Allergies  Allergen Reactions  . Codeine Hives   Review of Systems   Pertinent items are noted in the HPI. Otherwise, ROS is negative.  Vitals:   Vitals:   09/19/17 0749  BP: 108/66  Pulse: 83  Temp: 98.4 F (36.9 C)  TempSrc: Oral  SpO2: 98%  Weight: 178 lb 3.2 oz (80.8 kg)  Height: 5' 7.5" (1.715 m)     Body mass index is 27.5 kg/m.   Physical Exam:   Physical Exam  Constitutional: She is oriented to person, place, and time. She appears well-developed and well-nourished. No distress.    HENT:  Head: Normocephalic and atraumatic.  Right Ear: External ear normal.  Left Ear: External ear normal.  Nose: Nose normal.  Mouth/Throat: Oropharynx is clear and moist.  Eyes: Pupils are equal, round, and reactive to light. Conjunctivae and EOM are normal.  Neck: Normal range of motion. Neck supple. No thyromegaly present.  Cardiovascular: Normal rate, regular rhythm, normal heart sounds and intact distal pulses.  Pulmonary/Chest: Effort normal and breath sounds normal.  Abdominal: Soft. Bowel sounds are normal.  Musculoskeletal: Normal range of motion.  Lymphadenopathy:    She has no cervical adenopathy.  Neurological: She is alert and oriented to person, place, and time.  Skin: Skin is warm and dry. Capillary refill takes less than 2 seconds.  Psychiatric: She has a normal mood and affect. Her behavior is normal.  Nursing note and vitals reviewed.   Assessment and Plan:   Diagnoses and all orders for this visit:  Hypercalcemia -     Comp Met (CMET)  OCP (oral contraceptive pills) initiation -     norgestimate-ethinyl estradiol (SPRINTEC 28) 0.25-35 MG-MCG tablet; Take 1 tablet by mouth daily.  Menorrhagia, premenopausal -     norgestimate-ethinyl estradiol (SPRINTEC 28) 0.25-35 MG-MCG tablet; Take 1 tablet by mouth daily.  Recurrent cold sores -     valACYclovir (VALTREX) 1000  MG tablet; Take 1 tablet (1,000 mg total) by mouth 2 (two) times daily.  Subacute maxillary sinusitis -     amoxicillin (AMOXIL) 875 MG tablet; Take 1 tablet (875 mg total) by mouth 2 (two) times daily.   . Reviewed expectations re: course of current medical issues. . Discussed self-management of symptoms. . Outlined signs and symptoms indicating need for more acute intervention. . Patient verbalized understanding and all questions were answered. Marland Kitchen Health Maintenance issues including appropriate healthy diet, exercise, and smoking avoidance were discussed with patient. . See orders for this  visit as documented in the electronic medical record. . Patient received an After Visit Summary.  Briscoe Deutscher, DO [place] [place] 09/22/2017  No future appointments.

## 2017-09-22 ENCOUNTER — Encounter: Payer: Self-pay | Admitting: Family Medicine

## 2017-09-22 DIAGNOSIS — B001 Herpesviral vesicular dermatitis: Secondary | ICD-10-CM | POA: Insufficient documentation

## 2017-09-24 ENCOUNTER — Telehealth: Payer: Self-pay | Admitting: Surgical

## 2017-09-24 NOTE — Telephone Encounter (Signed)
Patient wanted to get her son in sooner with Dr. Juleen China for an acute visit. I have scheduled him for 09/26/17.

## 2017-09-24 NOTE — Telephone Encounter (Signed)
Copied from [place] [id number]. Topic: Quick Communication - See Telephone Encounter >> Sep 24, 2017 10:31 AM Antonieta Iba C wrote: CRM for notification. See Telephone encounter for: 09/24/17.  Pt called in requesting to speak with the office directly. Pt says that she was told by PCP to call in and ask to speak with liaison for the office for assistance. Tried office, unable to get through.   CB: [phone]

## 2017-09-26 ENCOUNTER — Ambulatory Visit: Payer: 59 | Admitting: Family Medicine

## 2017-10-10 ENCOUNTER — Telehealth: Payer: Self-pay

## 2017-10-10 NOTE — Telephone Encounter (Signed)
Copied from [place] [id number]. Topic: General - Other >> Oct 10, 2017  1:05 PM Pricilla Handler wrote: Reason for CRM: Patient called wanting to speak with the practice administrator. Patient would not give any details. She only said that the administrator would have to handle this situation.

## 2017-10-10 NOTE — Telephone Encounter (Signed)
Copied from [place] [id number]. Topic: General - Other >> Oct 10, 2017  1:49 PM Aurelio Brash B wrote: Reason for CRM:  PT states she needs to speak to the office liaison    call back [phone]

## 2017-11-25 MED FILL — FEMYNOR 0.25-35 MG-MCG TABS: 0.25-35 | 84 days supply | Qty: 84 | Fill #1

## 2018-02-14 MED FILL — FEMYNOR 0.25-35 MG-MCG TABS: 0.25-35 | 56 days supply | Qty: 56 | Fill #2

## 2018-04-01 ENCOUNTER — Other Ambulatory Visit: Payer: Self-pay | Admitting: Family Medicine

## 2018-04-01 DIAGNOSIS — N924 Excessive bleeding in the premenopausal period: Secondary | ICD-10-CM

## 2018-04-01 DIAGNOSIS — Z30011 Encounter for initial prescription of contraceptive pills: Secondary | ICD-10-CM

## 2018-04-01 MED FILL — FEMYNOR 0.25-35 MG-MCG TABS: 0.25-35 | 84 days supply | Qty: 84 | Fill #0

## 2018-07-26 ENCOUNTER — Encounter: Payer: Self-pay | Admitting: Family Medicine

## 2018-07-26 ENCOUNTER — Ambulatory Visit (INDEPENDENT_AMBULATORY_CARE_PROVIDER_SITE_OTHER): Payer: BC Managed Care – PPO | Admitting: Family Medicine

## 2018-07-26 VITALS — BP 108/62 | HR 74 | Temp 98.8°F | Ht 67.5 in | Wt 170.8 lb

## 2018-07-26 DIAGNOSIS — R519 Headache, unspecified: Secondary | ICD-10-CM

## 2018-07-26 DIAGNOSIS — R51 Headache: Secondary | ICD-10-CM

## 2018-07-26 MED ORDER — PROPRANOLOL HCL 20 MG PO TABS: 20.0000 mg | ORAL_TABLET | Freq: Three times a day (TID) | ORAL | 1 refills | Status: DC | PRN

## 2018-07-26 MED ORDER — NORGESTIM-ETH ESTRAD TRIPHASIC 0.18/0.215/0.25 MG-25 MCG PO TABS: 1.0000 | ORAL_TABLET | Freq: Every day | ORAL | 11 refills | Status: DC

## 2018-07-26 NOTE — Progress Notes (Signed)
Amy Odonnell is a 49 y.o. female is here for follow up.  History of Present Illness:   Amy Odonnell, CMA acting as scribe for Dr. Briscoe Odonnell.   HPI: Patient has had increased migraines over the last two weeks. She has had three. She has had improvement with increased caffeine intake. She has had pain and vision loss in right eye with light sensitivity. She denies any nausea or vomiting. Wonders if it is related to OCPs.  Health Maintenance Due  Topic Date Due  . PAP SMEAR-Modifier  05/22/2017   Depression screen PHQ 2/9 04/02/2017 08/23/2015  Decreased Interest 0 0  Down, Depressed, Hopeless 0 0  PHQ - 2 Score 0 0   PMHx, SurgHx, SocialHx, FamHx, Medications, and Allergies were reviewed in the Visit Navigator and updated as appropriate.   Patient Active Problem List   Diagnosis Date Noted  . 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 Smoking status: Never Smoker  . Smokeless tobacco: Never Used  Substance Use Topics  . Alcohol use: Yes    Comment: Social   . Drug use: No   Current Medications and Allergies    .  amoxicillin (AMOXIL) 875 MG tablet, Take 1 tablet (875 mg total) by mouth 2 (two) times daily., Disp: 20 tablet, Rfl: 0 .  FEMYNOR 0.25-35 MG-MCG tablet, TAKE 1 TABLET BY MOUTH DAILY., Disp: 112 tablet, Rfl: 1 .  valACYclovir (VALTREX) 1000 MG tablet, Take 1 tablet (1,000 mg total) by mouth 2 (two) times daily., Disp: 20 tablet, Rfl: 0   Allergies  Allergen Reactions  . Codeine Hives   Review of Systems   Pertinent items are noted in the HPI. Otherwise, a complete ROS is negative.  Vitals   Vitals:   07/26/18 1313  BP: 108/62  Pulse: 74  Temp: 98.8 F (37.1 C)  TempSrc: Oral  SpO2: 98%  Weight: 170 lb 12.8 oz (77.5 kg)  Height: 5' 7.5" (1.715 m)     Body mass index is 26.36 kg/m.  Physical Exam   Physical Exam Vitals signs and nursing note reviewed.  HENT:     Head: Normocephalic and atraumatic.  Eyes:   Pupils: Pupils are equal, round, and reactive to light.  Neck:     Musculoskeletal: Normal range of motion and neck supple.  Cardiovascular:     Rate and Rhythm: Normal rate and regular rhythm.     Heart sounds: Normal heart sounds.  Pulmonary:     Effort: Pulmonary effort is normal.  Abdominal:     Palpations: Abdomen is soft.  Skin:    General: Skin is warm.  Psychiatric:        Behavior: Behavior normal.    Assessment and Plan   Amy Odonnell was seen today for headache.  Diagnoses and all orders for this visit:  Nonintractable episodic headache, unspecified headache type -     propranolol (INDERAL) 20 MG tablet; Take 1 tablet (20 mg total) by mouth 3 (three) times daily as needed. -     Norgestimate-Ethinyl Estradiol Triphasic (ORTHO TRI-CYCLEN LO) 0.18/0.215/0.25 MG-25 MCG tab; Take 1 tablet by mouth daily.    . Orders and follow up as documented in [place], reviewed diet, exercise and weight control, cardiovascular risk and specific lipid/LDL goals reviewed, reviewed medications and side effects in detail.  . Reviewed expectations re: course of current medical issues. . Outlined signs and symptoms indicating need for more acute intervention. . Patient verbalized understanding and all  questions were answered. . Patient received an After Visit Summary.  CMA served as Education administrator during this visit. History, Physical, and Plan performed by medical provider. The above documentation has been reviewed and is accurate and complete. Amy Odonnell, D.O.   Amy Deutscher, DO [place] 08/04/2018

## 2018-07-29 ENCOUNTER — Encounter: Payer: Self-pay | Admitting: Family Medicine

## 2018-08-04 ENCOUNTER — Encounter: Payer: Self-pay | Admitting: Family Medicine

## 2018-09-09 ENCOUNTER — Encounter: Payer: Self-pay | Admitting: Family Medicine

## 2018-09-11 ENCOUNTER — Other Ambulatory Visit: Payer: Self-pay

## 2018-09-11 DIAGNOSIS — Z30011 Encounter for initial prescription of contraceptive pills: Secondary | ICD-10-CM

## 2018-09-11 DIAGNOSIS — N924 Excessive bleeding in the premenopausal period: Secondary | ICD-10-CM

## 2018-09-11 MED ORDER — NORGESTIMATE-ETH ESTRADIOL 0.25-35 MG-MCG PO TABS: 1.0000 | ORAL_TABLET | Freq: Every day | ORAL | 1 refills | Status: DC

## 2018-09-11 MED FILL — FEMYNOR 0.25-35 MG-MCG TABS: 0.25-35 | 84 days supply | Qty: 84 | Fill #0

## 2018-12-08 ENCOUNTER — Other Ambulatory Visit: Payer: Self-pay | Admitting: Family Medicine

## 2018-12-08 DIAGNOSIS — R519 Headache, unspecified: Secondary | ICD-10-CM

## 2019-01-01 ENCOUNTER — Encounter: Payer: Self-pay | Admitting: Family Medicine

## 2019-01-01 ENCOUNTER — Other Ambulatory Visit: Payer: Self-pay

## 2019-01-01 ENCOUNTER — Ambulatory Visit (INDEPENDENT_AMBULATORY_CARE_PROVIDER_SITE_OTHER): Payer: BC Managed Care – PPO | Admitting: Family Medicine

## 2019-01-01 VITALS — HR 76 | Ht 67.5 in | Wt 170.0 lb

## 2019-01-01 DIAGNOSIS — R002 Palpitations: Secondary | ICD-10-CM | POA: Diagnosis not present

## 2019-01-01 DIAGNOSIS — Z30011 Encounter for initial prescription of contraceptive pills: Secondary | ICD-10-CM | POA: Diagnosis not present

## 2019-01-01 DIAGNOSIS — N924 Excessive bleeding in the premenopausal period: Secondary | ICD-10-CM

## 2019-01-01 DIAGNOSIS — R51 Headache: Secondary | ICD-10-CM | POA: Diagnosis not present

## 2019-01-01 DIAGNOSIS — R519 Headache, unspecified: Secondary | ICD-10-CM

## 2019-01-01 DIAGNOSIS — Z13220 Encounter for screening for lipoid disorders: Secondary | ICD-10-CM

## 2019-01-01 MED ORDER — NORGESTIMATE-ETH ESTRADIOL 0.25-35 MG-MCG PO TABS: 1.0000 | ORAL_TABLET | Freq: Every day | ORAL | 6 refills | Status: DC

## 2019-01-01 NOTE — Progress Notes (Signed)
Virtual Visit via Video   Due to the COVID-19 pandemic, this visit was completed with telemedicine (audio/video) technology to reduce patient and provider exposure as well as to preserve personal protective equipment.   I connected with Amy Odonnell by a video enabled telemedicine application and verified that I am speaking with the correct person using two identifiers. Location patient: Home Location provider: [facility], Office Persons participating in the virtual visit: Stephnie, Parlier, DO [name], CMA acting as scribe for Dr. Briscoe Deutscher.   I discussed the limitations of evaluation and management by telemedicine and the availability of in person appointments. The patient expressed understanding and agreed to proceed.  Care Team   Patient Care Team: Briscoe Deutscher, DO as PCP - General (Family Medicine)  Subjective:   HPI: Patient with history of palpitations.  Nothing has changed in terms of diet, exercise, caffeine intake.  No supplements.  No alcohol or drug use.  Seems to be getting worse.  Had [place] in January.  History of the same.  Review of Systems  Constitutional: Negative for chills and fever.  HENT: Negative for hearing loss and tinnitus.   Eyes: Negative for blurred vision and double vision.  Respiratory: Negative for cough and wheezing.   Cardiovascular: Negative for chest pain, palpitations and leg swelling.  Gastrointestinal: Negative for nausea and vomiting.  Genitourinary: Negative for dysuria and urgency.  Neurological: Negative for dizziness and headaches.  Psychiatric/Behavioral: Negative for depression and suicidal ideas.    Patient Active Problem List   Diagnosis Date Noted  . 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 Smoking status: Never Smoker  . Smokeless tobacco: Never Used  Substance Use Topics  . Alcohol use: Yes    Comment: Social    Current Outpatient Medications:  .   norgestimate-ethinyl estradiol (FEMYNOR) 0.25-35 MG-MCG tablet, Take 1 tablet by mouth daily., Disp: 112 tablet, Rfl: 1 .  propranolol (INDERAL) 20 MG tablet, TAKE 1 TABLET (20 MG TOTAL) BY MOUTH 3 (THREE) TIMES DAILY AS NEEDED., Disp: 90 tablet, Rfl: 1 .  valACYclovir (VALTREX) 1000 MG tablet, Take 1 tablet (1,000 mg total) by mouth 2 (two) times daily., Disp: 20 tablet, Rfl: 0  Allergies  Allergen Reactions  . Codeine Hives   Objective:   VITALS: Per patient if applicable, see vitals. GENERAL: Alert, appears well and in no acute distress. HEENT: Atraumatic, conjunctiva clear, no obvious abnormalities on inspection of external nose and ears. NECK: Normal movements of the head and neck. CARDIOPULMONARY: No increased WOB. Speaking in clear sentences. I:E ratio WNL.  MS: Moves all visible extremities without noticeable abnormality. PSYCH: Pleasant and cooperative, well-groomed. Speech normal rate and rhythm. Affect is appropriate. Insight and judgement are appropriate. Attention is focused, linear, and appropriate.  NEURO: CN grossly intact. Oriented as arrived to appointment on time with no prompting. Moves both UE equally.  SKIN: No obvious lesions, wounds, erythema, or cyanosis noted on face or hands.  Depression screen [facility] 2/9 01/01/2019 04/02/2017 08/23/2015  Decreased Interest 0 0 0  Down, Depressed, Hopeless 0 0 0  PHQ - 2 Score 0 0 0  Altered sleeping 0 - -  Tired, decreased energy 0 - -  Change in appetite 0 - -  Feeling bad or failure about yourself  0 - -  Trouble concentrating 0 - -  Moving slowly or fidgety/restless 0 - -  Suicidal thoughts 0 - -  PHQ-9 Score 0 - -  Difficult doing work/chores Not difficult at all - -   Assessment and Plan:   Amy Odonnell was seen today for follow-up.  Diagnoses and all orders for this visit:  Palpitations -     CBC with Differential/Platelet; Future -     Comprehensive metabolic panel; Future -     Magnesium; Future -     TSH; Future -      T4, free; Future -     EKG 12-Lead; Future  OCP (oral contraceptive pills) initiation -     norgestimate-ethinyl estradiol (FEMYNOR) 0.25-35 MG-MCG tablet; Take 1 tablet by mouth daily.  Menorrhagia, premenopausal -     norgestimate-ethinyl estradiol (FEMYNOR) 0.25-35 MG-MCG tablet; Take 1 tablet by mouth daily.  Nonintractable episodic headache, unspecified headache type  Screening for lipid disorders -     Lipid panel; Future   . COVID-19 Education: The signs and symptoms of COVID-19 were discussed with the patient and how to seek care for testing if needed. The importance of social distancing was discussed today. . Reviewed expectations re: course of current medical issues. . Discussed self-management of symptoms. . Outlined signs and symptoms indicating need for more acute intervention. . Patient verbalized understanding and all questions were answered. Marland Kitchen Health Maintenance issues including appropriate healthy diet, exercise, and smoking avoidance were discussed with patient. . See orders for this visit as documented in the electronic medical record.  Briscoe Deutscher, DO  Records requested if needed. Time spent: 25 minutes, of which >50% was spent in obtaining information about her symptoms, reviewing her previous labs, evaluations, and treatments, counseling her about her condition (please see the discussed topics above), and developing a plan to further investigate it; she had a number of questions which I addressed.

## 2019-01-02 ENCOUNTER — Ambulatory Visit (INDEPENDENT_AMBULATORY_CARE_PROVIDER_SITE_OTHER): Payer: BC Managed Care – PPO

## 2019-01-02 ENCOUNTER — Other Ambulatory Visit (INDEPENDENT_AMBULATORY_CARE_PROVIDER_SITE_OTHER): Payer: BC Managed Care – PPO

## 2019-01-02 DIAGNOSIS — R002 Palpitations: Secondary | ICD-10-CM | POA: Diagnosis not present

## 2019-01-02 DIAGNOSIS — Z13220 Encounter for screening for lipoid disorders: Secondary | ICD-10-CM

## 2019-01-02 LAB — CBC WITH DIFFERENTIAL/PLATELET
Basophils Absolute: 0 10*3/uL (ref 0.0–0.1)
Basophils Relative: 0.4 % (ref 0.0–3.0)
Eosinophils Absolute: 0.2 10*3/uL (ref 0.0–0.7)
Eosinophils Relative: 2 % (ref 0.0–5.0)
HCT: 38.9 % (ref 36.0–46.0)
Hemoglobin: 13 g/dL (ref 12.0–15.0)
Lymphocytes Relative: 21.6 % (ref 12.0–46.0)
Lymphs Abs: 2 10*3/uL (ref 0.7–4.0)
MCHC: 33.5 g/dL (ref 30.0–36.0)
MCV: 88.4 fl (ref 78.0–100.0)
Monocytes Absolute: 0.7 10*3/uL (ref 0.1–1.0)
Monocytes Relative: 7.8 % (ref 3.0–12.0)
Neutro Abs: 6.2 10*3/uL (ref 1.4–7.7)
Neutrophils Relative %: 68.2 % (ref 43.0–77.0)
Platelets: 344 10*3/uL (ref 150.0–400.0)
RBC: 4.4 Mil/uL (ref 3.87–5.11)
RDW: 13.4 % (ref 11.5–15.5)
WBC: 9.2 10*3/uL (ref 4.0–10.5)

## 2019-01-02 LAB — LIPID PANEL
Cholesterol: 220 mg/dL — ABNORMAL HIGH (ref 0–200)
HDL: 58.1 mg/dL (ref >39.00)
LDL Cholesterol: 139 mg/dL — ABNORMAL HIGH (ref 0–99)
NonHDL: 161.5
Total CHOL/HDL Ratio: 4
Triglycerides: 115 mg/dL (ref 0.0–149.0)
VLDL: 23 mg/dL (ref 0.0–40.0)

## 2019-01-02 LAB — COMPREHENSIVE METABOLIC PANEL
ALT: 11 U/L (ref 0–35)
AST: 13 U/L (ref 0–37)
Albumin: 4.2 g/dL (ref 3.5–5.2)
Alkaline Phosphatase: 51 U/L (ref 39–117)
BUN: 14 mg/dL (ref 6–23)
CO2: 27 mEq/L (ref 19–32)
Calcium: 9.3 mg/dL (ref 8.4–10.5)
Chloride: 101 mEq/L (ref 96–112)
Creatinine, Ser: 0.76 mg/dL (ref 0.40–1.20)
GFR: 81.4 mL/min (ref >60.00)
Glucose, Bld: 95 mg/dL (ref 70–99)
Potassium: 4.3 mEq/L (ref 3.5–5.1)
Sodium: 136 mEq/L (ref 135–145)
Total Bilirubin: 0.7 mg/dL (ref 0.2–1.2)
Total Protein: 6.5 g/dL (ref 6.0–8.3)

## 2019-01-02 LAB — TSH: TSH: 1.93 u[IU]/mL (ref 0.35–4.50)

## 2019-01-02 LAB — MAGNESIUM: Magnesium: 2.1 mg/dL (ref 1.5–2.5)

## 2019-01-02 LAB — T4, FREE: Free T4: 1.02 ng/dL (ref 0.60–1.60)

## 2019-01-02 MED ORDER — METOPROLOL TARTRATE 25 MG PO TABS: 12.5000 mg | ORAL_TABLET | Freq: Two times a day (BID) | ORAL | 0 refills | Status: DC

## 2019-01-02 NOTE — Progress Notes (Signed)
Patient asked by Dr. Juleen China to come in for EKG following virtual visit yesterday. Results have been reviewed with Dr. Juleen China and patient. No further action needed.

## 2019-01-05 NOTE — Progress Notes (Signed)
Amy Odonnell is a 48 y.o. female is here for follow up.  History of Present Illness:   HPI: See previous visit. Still with palpitations. Metoprolol not helpful, felt better with Propranolol. No CP. Feels SOB with palpitations.   Health Maintenance Due  Topic Date Due  . PAP SMEAR-Modifier  05/22/2017  . INFLUENZA VACCINE  12/21/2018   Depression screen [facility] 2/9 01/01/2019 04/02/2017 08/23/2015  Decreased Interest 0 0 0  Down, Depressed, Hopeless 0 0 0  PHQ - 2 Score 0 0 0  Altered sleeping 0 - -  Tired, decreased energy 0 - -  Change in appetite 0 - -  Feeling bad or failure about yourself  0 - -  Trouble concentrating 0 - -  Moving slowly or fidgety/restless 0 - -  Suicidal thoughts 0 - -  PHQ-9 Score 0 - -  Difficult doing work/chores Not difficult at all - -   PMHx, SurgHx, SocialHx, FamHx, Medications, and Allergies were reviewed in the Visit Navigator and updated as appropriate.   Patient Active Problem List   Diagnosis Date Noted  . 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 Smoking status: Never Smoker  . Smokeless tobacco: Never Used  Substance Use Topics  . Alcohol use: Yes    Comment: Social   . Drug use: No   Current Medications and Allergies   .  metoprolol tartrate (LOPRESSOR) 25 MG tablet, Take 0.5 tablets (12.5 mg total) by mouth 2 (two) times daily., Disp: 90 tablet, Rfl: 0 .  norgestimate-ethinyl estradiol (FEMYNOR) 0.25-35 MG-MCG tablet, Take 1 tablet by mouth daily., Disp: 1 Package, Rfl: 6    Allergies  Allergen Reactions  . Codeine Hives   Review of Systems   Pertinent items are noted in the HPI. Otherwise, a complete ROS is negative.  Vitals   Vitals:   01/06/19 1445  BP: 110/74  Pulse: 84  Temp: 98.6 F (37 C)  TempSrc: Temporal  SpO2: 97%  Weight: 180 lb 9.6 oz (81.9 kg)  Height: 5' 7.5" (1.715 m)     Body mass index is 27.87 kg/m.  Physical Exam   Physical Exam Vitals signs and nursing note  reviewed.  HENT:     Head: Normocephalic and atraumatic.  Eyes:     Pupils: Pupils are equal, round, and reactive to light.  Neck:     Musculoskeletal: Normal range of motion and neck supple.  Cardiovascular:     Rate and Rhythm: Normal rate and regular rhythm.     Heart sounds: Normal heart sounds.  Pulmonary:     Effort: Pulmonary effort is normal.  Abdominal:     Palpations: Abdomen is soft.  Skin:    General: Skin is warm.  Psychiatric:        Behavior: Behavior normal.    EKG: normal EKG, normal sinus rhythm, unchanged from previous tracings.   Assessment and Plan   Amy Odonnell was seen today for medication follow up.  Diagnoses and all orders for this visit:  Tachycardia -     EKG 12-Lead -     propranolol (INDERAL) 20 MG tablet; Take 1 tablet (20 mg total) by mouth 3 (three) times daily as needed. -     ECHOCARDIOGRAM COMPLETE; Future   . Orders and follow up as documented in [place], reviewed diet, exercise and weight control, cardiovascular risk and specific lipid/LDL goals reviewed, reviewed medications and side effects in detail.  . Reviewed expectations re: course  of current medical issues. . Outlined signs and symptoms indicating need for more acute intervention. . Patient verbalized understanding and all questions were answered. . Patient received an After Visit Summary.  Briscoe Deutscher, DO [place] 01/13/2019

## 2019-01-06 ENCOUNTER — Encounter: Payer: Self-pay | Admitting: Family Medicine

## 2019-01-06 ENCOUNTER — Other Ambulatory Visit: Payer: Self-pay

## 2019-01-06 ENCOUNTER — Ambulatory Visit (INDEPENDENT_AMBULATORY_CARE_PROVIDER_SITE_OTHER): Payer: BC Managed Care – PPO | Admitting: Family Medicine

## 2019-01-06 VITALS — BP 110/74 | HR 84 | Temp 98.6°F | Ht 67.5 in | Wt 180.6 lb

## 2019-01-06 DIAGNOSIS — R000 Tachycardia, unspecified: Secondary | ICD-10-CM

## 2019-01-06 MED ORDER — PROPRANOLOL HCL 20 MG PO TABS: 20.0000 mg | ORAL_TABLET | Freq: Three times a day (TID) | ORAL | 1 refills | Status: DC | PRN

## 2019-01-11 ENCOUNTER — Encounter: Payer: Self-pay | Admitting: Family Medicine

## 2019-01-14 ENCOUNTER — Telehealth: Payer: Self-pay | Admitting: Family Medicine

## 2019-01-14 NOTE — Telephone Encounter (Signed)
  Order Request Summary  Order ID: [id number] Request Status:  Authorized Health Plan: [employer]   Valid Dates: 01/14/2019 - 07/12/2019 Scheduled Date of Service: 01/17/2019   Member Information: Amy Odonnell, Amy Odonnell Member #: [id number] [place] , [place] [id number] Date of Birth: 18-Jul-1970 Phone: [phone] Ordering Provider: Briscoe Deutscher [place] , [place] [id number] Phone: [phone] Fax: [phone] NPI: [id number] Servicing Provider:  [facility] [place] , [place] [address] Phone: [phone] Fax: NPI: [id number]    The Order Number covers one of the following applicable codes when the outcome is Authorized or Completed.  CPT GROUP DETAILS CPT GROUP CPT DESCRIPTION CPT GROUP DESCRIPTION 93303 ECHO, transthoracic, complete cng Resting Transthoracic Echocardiography 93304 ECHO, transthoracic followup/limited cng Resting Transthoracic Echocardiography 93306 ECHO, transthoracic w/doppler, complete Resting Transthoracic Echocardiography 93307 ECHO, transthoracic, heart, complete Resting Transthoracic Echocardiography 93308 ECHO, transthoracic, heart, limited Resting Transthoracic Echocardiography

## 2019-01-17 ENCOUNTER — Other Ambulatory Visit: Payer: Self-pay

## 2019-01-17 ENCOUNTER — Ambulatory Visit (HOSPITAL_COMMUNITY): Payer: BC Managed Care – PPO | Attending: Cardiology

## 2019-01-17 DIAGNOSIS — R000 Tachycardia, unspecified: Secondary | ICD-10-CM | POA: Diagnosis present

## 2019-01-20 ENCOUNTER — Other Ambulatory Visit: Payer: Self-pay

## 2019-01-20 DIAGNOSIS — R9431 Abnormal electrocardiogram [ECG] [EKG]: Secondary | ICD-10-CM

## 2019-01-21 ENCOUNTER — Telehealth: Payer: Self-pay | Admitting: Cardiology

## 2019-01-21 NOTE — Telephone Encounter (Signed)
LVM for patient to call and schedule new cardiology patient appointment for abnormal patient activated cardiac event monitor.

## 2019-01-23 ENCOUNTER — Encounter: Payer: Self-pay | Admitting: Family Medicine

## 2019-01-24 NOTE — Telephone Encounter (Signed)
Called patient reviewed all information no more questions.

## 2019-01-28 MED FILL — NORGESTIMATE-ETH ESTRADIOL: 0.25-35 | 84 days supply | Qty: 84 | Fill #1

## 2019-01-30 NOTE — Progress Notes (Signed)
Cardiology Office Note   Date:  01/31/2019   ID:  Amy Odonnell, DOB 12/09/1970, MRN [id number]  PCP:  Briscoe Deutscher, DO  Cardiologist:   Marliyah Reid Martinique, MD   Chief Complaint  Patient presents with  . Palpitations      History of Present Illness: Amy Odonnell is a 48 y.o. female who is seen at the request of Dr Juleen China for evaluation of palpitations. 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  She reports that since February of this year she has noticed increased palpitations. She will note that her resting HR will increase from baseline in the 60s up to 95. Sometimes she feels her HR increase to 120 and it feels like it is racing. She feels jittery. This makes her feel uncomfortable. No chest pain or dyspnea. No dizziness or syncope. She has tried metoprolol and atenolol but this didn't really help. Propranolol has really helped a lot. Does not use stimulants. Does note she is under a lot of stress.     Past Medical History:  Diagnosis Date  . Asthma   . Migraine   . Seasonal allergies     Past Surgical History:  Procedure Laterality Date  . APPENDECTOMY       Current Outpatient Medications  Medication Sig Dispense Refill  . norgestimate-ethinyl estradiol (FEMYNOR) 0.25-35 MG-MCG tablet Take 1 tablet by mouth daily. 1 Package 6  . propranolol (INDERAL) 20 MG tablet Take 1 tablet (20 mg total) by mouth 3 (three) times daily as needed. 90 tablet 1   No current facility-administered medications for this visit.     Allergies:   Codeine    Social History:  The patient  reports that she has never smoked. She has never used smokeless tobacco. She reports current alcohol use. She reports that she does not use drugs.   Family History:  The patient's family history includes Asthma in her mother; Diabetes in her maternal grandmother; Hyperlipidemia in her father; Hypertension in her maternal grandmother; Hypothyroidism in her mother.    ROS:  Please see the history of  present illness.   Otherwise, review of systems are positive for none.   All other systems are reviewed and negative.    PHYSICAL EXAM: VS:  BP 126/78   Pulse 66   Temp 97.7 F (36.5 C)   Ht 5' 7.5" (1.715 m)   Wt 181 lb (82.1 kg)   LMP 01/02/2019   SpO2 99%   BMI 27.93 kg/m  , BMI Body mass index is 27.93 kg/m. GEN: Well nourished, well developed, in no acute distress  HEENT: normal  Neck: no JVD, carotid bruits, or masses Cardiac: RRR; no murmurs, rubs, or gallops,no edema  Respiratory:  clear to auscultation bilaterally, normal work of breathing GI: soft, nontender, nondistended, + BS MS: no deformity or atrophy  Skin: warm and dry, no rash Neuro:  Strength and sensation are intact Psych: euthymic mood, full affect   EKG:  EKG is not ordered today. The ekg ordered 01/06/19 demonstrates NSR with normal Ecg. I have personally reviewed and interpreted this study.    Recent Labs: 01/02/2019: ALT 11; BUN 14; Creatinine, Ser 0.76; Hemoglobin 13.0; Magnesium 2.1; Platelets 344.0; Potassium 4.3; Sodium 136; TSH 1.93    Lipid Panel    Component Value Date/Time   CHOL 220 (H) 01/02/2019 1019   TRIG 115.0 01/02/2019 1019   HDL 58.10 01/02/2019 1019   CHOLHDL 4 01/02/2019 1019   VLDL 23.0 01/02/2019 1019  LDLCALC 139 (H) 01/02/2019 1019      Wt Readings from Last 3 Encounters:  01/31/19 181 lb (82.1 kg)  01/06/19 180 lb 9.6 oz (81.9 kg)  01/01/19 170 lb (77.1 kg)      Other studies Reviewed: Additional studies/ records that were reviewed today include:   Echo 01/17/19: IMPRESSIONS    1. The left ventricle has normal systolic function with an ejection fraction of 60-65%. The cavity size was normal. Left ventricular diastolic parameters were normal. No evidence of left ventricular regional wall motion abnormalities.  2. The right ventricle has normal systolic function. The cavity was normal. There is no increase in right ventricular wall thickness. Right ventricular  systolic pressure is normal.  3. The aorta is normal unless otherwise noted.   ASSESSMENT AND PLAN:  1.  Palpitations. Improved with propranlolol. Some episodes of racing. This is occurring daily. Recommend a 48 hour Holter monitor to assess rhythm. Since propranolol helps may consider switching to long acting formulation.   Current medicines are reviewed at length with the patient today.  The patient does not have concerns regarding medicines.  The following changes have been made:  no change  Labs/ tests ordered today include:   Orders Placed This Encounter  Procedures  . Holter monitor - 48 hour  . EKG 12-Lead     Disposition:   FU TBD  Signed, Milburn Freeney Martinique, MD  01/31/2019 10:25 AM    [facility] [address] [place], [place], [address] Phone [phone], Fax [phone]

## 2019-01-31 ENCOUNTER — Encounter: Payer: Self-pay | Admitting: Cardiology

## 2019-01-31 ENCOUNTER — Other Ambulatory Visit: Payer: Self-pay

## 2019-01-31 ENCOUNTER — Ambulatory Visit (INDEPENDENT_AMBULATORY_CARE_PROVIDER_SITE_OTHER): Payer: BC Managed Care – PPO | Admitting: Cardiology

## 2019-01-31 DIAGNOSIS — R002 Palpitations: Secondary | ICD-10-CM

## 2019-01-31 NOTE — Patient Instructions (Signed)
Medication Instructions:  Continue same medications   Lab work: None ordered   Testing/Procedures: Schedule 48 hour holter monitor   Follow-Up: At [facility], you and your health needs are our priority.  As part of our continuing mission to provide you with exceptional heart care, we have created designated Provider Care Teams.  These Care Teams include your primary Cardiologist (physician) and Advanced Practice Providers (APPs -  Physician Assistants and Nurse Practitioners) who all work together to provide you with the care you need, when you need it. . Follow up appointment will be determined after monitor

## 2019-02-06 ENCOUNTER — Telehealth: Payer: Self-pay | Admitting: *Deleted

## 2019-02-06 NOTE — Telephone Encounter (Signed)
3 day ZIO XT long term holter monitor to be mailed to the patients home.  Instructions reviewed briefly as they are included in the monitor kit.  Patient may remove monitor after 48 hours.

## 2019-02-14 ENCOUNTER — Ambulatory Visit (INDEPENDENT_AMBULATORY_CARE_PROVIDER_SITE_OTHER): Payer: BC Managed Care – PPO

## 2019-02-14 DIAGNOSIS — R002 Palpitations: Secondary | ICD-10-CM

## 2019-03-03 ENCOUNTER — Other Ambulatory Visit: Payer: Self-pay | Admitting: Cardiology

## 2019-03-03 DIAGNOSIS — R002 Palpitations: Secondary | ICD-10-CM

## 2019-03-05 ENCOUNTER — Telehealth: Payer: Self-pay

## 2019-03-05 NOTE — Telephone Encounter (Signed)
Spoke to patient monitor results given.She stated she has noticed when she changes positions her heart beats fast.She continues to take Inderal 10 mg twice a day to three times a day.Dr.Jordan advised a follow up appointment in 2 months.She stated she will call back to make appointment if she gets worse.

## 2019-03-27 ENCOUNTER — Other Ambulatory Visit: Payer: Self-pay | Admitting: Family Medicine

## 2019-04-21 ENCOUNTER — Other Ambulatory Visit: Payer: Self-pay

## 2019-04-21 DIAGNOSIS — Z20822 Contact with and (suspected) exposure to covid-19: Secondary | ICD-10-CM

## 2019-04-22 LAB — NOVEL CORONAVIRUS, NAA: SARS-CoV-2, NAA: NOT DETECTED

## 2020-02-26 ENCOUNTER — Other Ambulatory Visit: Payer: Self-pay

## 2020-02-27 ENCOUNTER — Ambulatory Visit (INDEPENDENT_AMBULATORY_CARE_PROVIDER_SITE_OTHER): Payer: BC Managed Care – PPO | Admitting: Family Medicine

## 2020-02-27 ENCOUNTER — Encounter: Payer: Self-pay | Admitting: Family Medicine

## 2020-02-27 VITALS — BP 116/78 | HR 73 | Temp 98.3°F | Ht 67.5 in | Wt 174.4 lb

## 2020-02-27 DIAGNOSIS — R7301 Impaired fasting glucose: Secondary | ICD-10-CM

## 2020-02-27 DIAGNOSIS — N924 Excessive bleeding in the premenopausal period: Secondary | ICD-10-CM

## 2020-02-27 DIAGNOSIS — Z13220 Encounter for screening for lipoid disorders: Secondary | ICD-10-CM

## 2020-02-27 DIAGNOSIS — R195 Other fecal abnormalities: Secondary | ICD-10-CM | POA: Diagnosis not present

## 2020-02-27 DIAGNOSIS — R5383 Other fatigue: Secondary | ICD-10-CM

## 2020-02-27 DIAGNOSIS — Z1159 Encounter for screening for other viral diseases: Secondary | ICD-10-CM

## 2020-02-27 NOTE — Progress Notes (Signed)
Amy Odonnell DOB: 1970-12-31 Encounter date: 02/27/2020  This isa 49 y.o. female who presents to establish care. Chief Complaint  Patient presents with  . Establish Care    History of present illness: Did see cardio last fall for palpitations; improved with propranolol. Echo 12/2018 stable. Heart monitor was normal rhythm with sinus tach; cardio suggested beta blocker if feeling better with med. Saw Dr. Martinique for this. Not getting these as often but does still get them; does note that they are linked to stress and not as constant as they were a year ago.   Has one concern: rapid GI transit times. Does feel like it is related to estrogen surge. Does occasionally take ocp to help with period regulation due to peri-menopause. Really doesn't like being on ocp, but easy for them to be off track. In parts of cycle when estrogen is not peaking tends to do better. It is worse on birth control pills. Frequently just liquid; and sometimes just a little. No abd pain. Is under a lot of stress but has been for a few years. On ocp - loose, rapid stools. Can have more normal stools when not on ocp. Has been going on for awhile and is getting progressively worse. Used to have issues with constipation, but hasn't been constipated for 3 years. Week of period then stools are still loose, frequent, but are tolerable. No urgency to stool; just that it is liquid when she does go. If she eats then there will be some stool particles in liquid but if she doesn't eat then brown liquid. A week ago ate giant bowl of lentils. Helped when it came through. Does take probiotic. Sometimes there is blood when liquid comes out, but not in stools.   She is tired, overworked, stressed. Teaches at [employer].   She does sleep ok. situationally overwhelmed but denies depressed mood.    Past Medical History:  Diagnosis Date  . Asthma   . Migraine   . Seasonal allergies    Past Surgical History:  Procedure Laterality Date  .  APPENDECTOMY     Allergies  Allergen Reactions  . Codeine Hives   Current Meds  Medication Sig  . norgestimate-ethinyl estradiol (FEMYNOR) 0.25-35 MG-MCG tablet Take 1 tablet by mouth daily.   Social History   Tobacco Use  . Smoking status: Never Smoker  . Smokeless tobacco: Never Used  Substance Use Topics  . Alcohol use: Yes    Comment: Social    Family History  Problem Relation Age of Onset  . Asthma Mother   . Hypothyroidism Mother   . Obesity Mother   . Depression Mother   . Arthritis Mother   . Diabetes Maternal Grandmother   . Hypertension Maternal Grandmother   . Hyperlipidemia Father   . Other Father        hyperglycemia  . Depression Sister      Review of Systems  Constitutional: Positive for fatigue. Negative for activity change, appetite change, chills, fever and unexpected weight change.  HENT: Negative for congestion, ear pain, hearing loss, sinus pressure, sinus pain, sore throat and trouble swallowing.   Eyes: Negative for pain and visual disturbance.  Respiratory: Negative for cough, chest tightness, shortness of breath and wheezing.   Cardiovascular: Negative for chest pain, palpitations and leg swelling.  Gastrointestinal: Positive for diarrhea. Negative for abdominal pain, blood in stool, constipation, nausea and vomiting.  Genitourinary: Negative for difficulty urinating and menstrual problem.  Musculoskeletal: Negative for arthralgias and back pain.  Skin: Negative for rash.  Neurological: Negative for dizziness, weakness, numbness and headaches.  Hematological: Negative for adenopathy. Does not bruise/bleed easily.  Psychiatric/Behavioral: Negative for sleep disturbance and suicidal ideas. The patient is not nervous/anxious.     Objective:  BP 116/78 (BP Location: Left Arm, Patient Position: Sitting, Cuff Size: Normal)   Pulse 73   Temp 98.3 F (36.8 C) (Oral)   Ht 5' 7.5" (1.715 m)   Wt 174 lb 6.4 oz (79.1 kg)   LMP 02/13/2020  (Approximate)   BMI 26.91 kg/m   Weight: 174 lb 6.4 oz (79.1 kg)   BP Readings from Last 3 Encounters:  02/27/20 116/78  01/31/19 126/78  01/06/19 110/74   Wt Readings from Last 3 Encounters:  02/27/20 174 lb 6.4 oz (79.1 kg)  01/31/19 181 lb (82.1 kg)  01/06/19 180 lb 9.6 oz (81.9 kg)    Physical Exam Constitutional:      General: She is not in acute distress.    Appearance: She is well-developed.  HENT:     Head: Normocephalic and atraumatic.     Right Ear: External ear normal.     Left Ear: External ear normal.     Mouth/Throat:     Pharynx: No oropharyngeal exudate.  Eyes:     Conjunctiva/sclera: Conjunctivae normal.     Pupils: Pupils are equal, round, and reactive to light.  Neck:     Thyroid: No thyromegaly.  Cardiovascular:     Rate and Rhythm: Normal rate and regular rhythm.     Heart sounds: Normal heart sounds. No murmur heard.  No friction rub. No gallop.   Pulmonary:     Effort: Pulmonary effort is normal.     Breath sounds: Normal breath sounds.  Abdominal:     General: Bowel sounds are normal. There is no distension.     Palpations: Abdomen is soft. There is no mass.     Tenderness: There is no abdominal tenderness. There is no guarding or rebound.     Hernia: No hernia is present.  Musculoskeletal:        General: No tenderness or deformity. Normal range of motion.     Cervical back: Normal range of motion and neck supple.  Lymphadenopathy:     Cervical: No cervical adenopathy.  Skin:    General: Skin is warm and dry.     Findings: No rash.  Neurological:     Mental Status: She is alert and oriented to person, place, and time.     Deep Tendon Reflexes: Reflexes normal.     Reflex Scores:      Tricep reflexes are 2+ on the right side and 2+ on the left side.      Bicep reflexes are 2+ on the right side and 2+ on the left side.      Brachioradialis reflexes are 2+ on the right side and 2+ on the left side.      Patellar reflexes are 2+ on the  right side and 2+ on the left side. Psychiatric:        Speech: Speech normal.        Behavior: Behavior normal.        Thought Content: Thought content normal.     Assessment/Plan:  1. Loose stools She is willing to try to bulk up stools with some fiber supplementation.  She does generally eat a lot of natural fiber in her diet, but is going to try Metamucil.  She will let me know  if this does not work.  2. Excessive bleeding in premenopausal period She is gone back and forth with the idea of birth control but stools seem worse when she is taking birth control, but bleeding gets quite heavy without.   3. Fatigue, unspecified type - CBC with Differential/Platelet; Future - Comprehensive metabolic panel; Future - TSH; Future - VITAMIN D 25 Hydroxy (Vit-D Deficiency, Fractures); Future - Vitamin B12; Future  4. Elevated fasting glucose - Hemoglobin A1c; Future  5. Lipid screening - Lipid panel; Future  6. Encounter for hepatitis C screening test for low risk patient - Hepatitis C antibody; Future  Return if symptoms worsen or fail to improve.  Micheline Rough, MD

## 2020-03-01 ENCOUNTER — Other Ambulatory Visit: Payer: Self-pay

## 2020-03-02 ENCOUNTER — Other Ambulatory Visit: Payer: BC Managed Care – PPO

## 2020-03-02 DIAGNOSIS — R5383 Other fatigue: Secondary | ICD-10-CM

## 2020-03-02 DIAGNOSIS — Z1159 Encounter for screening for other viral diseases: Secondary | ICD-10-CM

## 2020-03-02 DIAGNOSIS — R7301 Impaired fasting glucose: Secondary | ICD-10-CM

## 2020-03-02 DIAGNOSIS — Z13220 Encounter for screening for lipoid disorders: Secondary | ICD-10-CM

## 2020-03-03 LAB — LIPID PANEL
Cholesterol: 196 mg/dL (ref <200)
HDL: 57 mg/dL (ref >=50)
LDL Cholesterol (Calc): 114 mg/dL (calc) — ABNORMAL HIGH
Non-HDL Cholesterol (Calc): 139 mg/dL (calc) — ABNORMAL HIGH (ref <130)
Total CHOL/HDL Ratio: 3.4 (calc) (ref <5.0)
Triglycerides: 134 mg/dL (ref <150)

## 2020-03-03 LAB — COMPREHENSIVE METABOLIC PANEL
AG Ratio: 1.6 (calc) (ref 1.0–2.5)
ALT: 6 U/L (ref 6–29)
AST: 10 U/L (ref 10–35)
Albumin: 4 g/dL (ref 3.6–5.1)
Alkaline phosphatase (APISO): 43 U/L (ref 31–125)
BUN: 11 mg/dL (ref 7–25)
CO2: 21 mmol/L (ref 20–32)
Calcium: 8.6 mg/dL (ref 8.6–10.2)
Chloride: 106 mmol/L (ref 98–110)
Creat: 0.72 mg/dL (ref 0.50–1.10)
Globulin: 2.5 g/dL (calc) (ref 1.9–3.7)
Glucose, Bld: 87 mg/dL (ref 65–99)
Potassium: 4.5 mmol/L (ref 3.5–5.3)
Sodium: 136 mmol/L (ref 135–146)
Total Bilirubin: 0.4 mg/dL (ref 0.2–1.2)
Total Protein: 6.5 g/dL (ref 6.1–8.1)

## 2020-03-03 LAB — CBC WITH DIFFERENTIAL/PLATELET
Absolute Monocytes: 531 cells/uL (ref 200–950)
Basophils Absolute: 52 cells/uL (ref 0–200)
Basophils Relative: 0.6 %
Eosinophils Absolute: 113 cells/uL (ref 15–500)
Eosinophils Relative: 1.3 %
HCT: 37.6 % (ref 35.0–45.0)
Hemoglobin: 12.5 g/dL (ref 11.7–15.5)
Lymphs Abs: 1636 cells/uL (ref 850–3900)
MCH: 29.9 pg (ref 27.0–33.0)
MCHC: 33.2 g/dL (ref 32.0–36.0)
MCV: 90 fL (ref 80.0–100.0)
MPV: 10.6 fL (ref 7.5–12.5)
Monocytes Relative: 6.1 %
Neutro Abs: 6368 cells/uL (ref 1500–7800)
Neutrophils Relative %: 73.2 %
Platelets: 361 10*3/uL (ref 140–400)
RBC: 4.18 10*6/uL (ref 3.80–5.10)
RDW: 12.4 % (ref 11.0–15.0)
Total Lymphocyte: 18.8 %
WBC: 8.7 10*3/uL (ref 3.8–10.8)

## 2020-03-03 LAB — HEMOGLOBIN A1C
Hgb A1c MFr Bld: 5.2 % of total Hgb (ref <5.7)
Mean Plasma Glucose: 103 (calc)
eAG (mmol/L): 5.7 (calc)

## 2020-03-03 LAB — VITAMIN B12: Vitamin B-12: 225 pg/mL (ref 200–1100)

## 2020-03-03 LAB — VITAMIN D 25 HYDROXY (VIT D DEFICIENCY, FRACTURES): Vit D, 25-Hydroxy: 26 ng/mL — ABNORMAL LOW (ref 30–100)

## 2020-03-03 LAB — HEPATITIS C ANTIBODY
Hepatitis C Ab: NONREACTIVE
SIGNAL TO CUT-OFF: 0.01 (ref <1.00)

## 2020-03-03 LAB — TSH: TSH: 2.04 mIU/L

## 2020-04-13 ENCOUNTER — Ambulatory Visit: Payer: BC Managed Care – PPO | Attending: Internal Medicine

## 2020-04-13 DIAGNOSIS — Z23 Encounter for immunization: Secondary | ICD-10-CM

## 2020-04-13 NOTE — Progress Notes (Signed)
   Covid-19 Vaccination Clinic  Name:  Marceil Welp    MRN: [id number] DOB: Aug 31, 1970  04/13/2020  Ms. Tomasetti was observed post Covid-19 immunization for 15 minutes without incident. She was provided with Vaccine Information Sheet and instruction to access the V-Safe system.   Ms. Graffam was instructed to call 911 with any severe reactions post vaccine: Marland Kitchen Difficulty breathing  . Swelling of face and throat  . A fast heartbeat  . A bad rash all over body  . Dizziness and weakness   Immunizations Administered    No immunizations on file.

## 2020-05-25 ENCOUNTER — Encounter: Payer: Self-pay | Admitting: Family Medicine

## 2020-05-27 ENCOUNTER — Other Ambulatory Visit: Payer: Self-pay | Admitting: Family Medicine

## 2020-05-27 DIAGNOSIS — Z30011 Encounter for initial prescription of contraceptive pills: Secondary | ICD-10-CM

## 2020-05-27 DIAGNOSIS — N924 Excessive bleeding in the premenopausal period: Secondary | ICD-10-CM

## 2020-05-27 DIAGNOSIS — B001 Herpesviral vesicular dermatitis: Secondary | ICD-10-CM

## 2020-05-27 MED ORDER — VALACYCLOVIR HCL 500 MG PO TABS: 500.0000 mg | ORAL_TABLET | Freq: Two times a day (BID) | ORAL | 5 refills | Status: AC

## 2020-05-27 MED ORDER — NORGESTIMATE-ETH ESTRADIOL 0.25-35 MG-MCG PO TABS: 1.0000 | ORAL_TABLET | Freq: Every day | ORAL | 3 refills | Status: DC

## 2020-11-15 ENCOUNTER — Encounter: Payer: Self-pay | Admitting: Family Medicine

## 2020-11-15 DIAGNOSIS — J4520 Mild intermittent asthma, uncomplicated: Secondary | ICD-10-CM

## 2020-11-25 MED ORDER — AEROCHAMBER PLUS MISC: 0 refills | Status: DC

## 2020-11-25 MED ORDER — ALBUTEROL SULFATE HFA 108 (90 BASE) MCG/ACT IN AERS: 2.0000 | INHALATION_SPRAY | Freq: Four times a day (QID) | RESPIRATORY_TRACT | 0 refills | Status: DC | PRN

## 2021-02-14 ENCOUNTER — Encounter: Payer: Self-pay | Admitting: Family Medicine

## 2021-02-16 ENCOUNTER — Ambulatory Visit: Payer: Self-pay | Attending: Internal Medicine

## 2021-02-16 DIAGNOSIS — Z23 Encounter for immunization: Secondary | ICD-10-CM

## 2021-02-16 NOTE — Progress Notes (Signed)
   Cov[facility] Vaccination [facility]  Name:  Amy Odonnell    MRN: [id number] DOB: 1970-10-15  02/16/2021  Amy Odonnell was observed post Covid-19 immunization for 15 minutes without incident. She was provided with Vaccine Information Sheet and instruction to access the V-Safe system.   Amy Odonnell was instructed to call 911 with any severe reactions post vaccine: Difficulty breathing  Swelling of face and throat  A fast heartbeat  A bad rash all over body  Dizziness and weakness

## 2021-02-18 ENCOUNTER — Ambulatory Visit: Payer: BC Managed Care – PPO

## 2021-02-25 ENCOUNTER — Other Ambulatory Visit (HOSPITAL_BASED_OUTPATIENT_CLINIC_OR_DEPARTMENT_OTHER): Payer: Self-pay

## 2021-02-25 MED ORDER — COVID-19 MRNA VACC (MODERNA) 100 MCG/0.5ML IM SUSP
INTRAMUSCULAR | 0 refills | Status: DC
  Filled 2021-02-25: qty 0.5, 1d supply, fill #0

## 2021-04-24 ENCOUNTER — Other Ambulatory Visit: Payer: Self-pay | Admitting: Family Medicine

## 2021-04-24 DIAGNOSIS — Z30011 Encounter for initial prescription of contraceptive pills: Secondary | ICD-10-CM

## 2021-04-24 DIAGNOSIS — N924 Excessive bleeding in the premenopausal period: Secondary | ICD-10-CM

## 2021-07-04 ENCOUNTER — Other Ambulatory Visit: Payer: Self-pay | Admitting: Family Medicine

## 2021-07-04 DIAGNOSIS — Z1211 Encounter for screening for malignant neoplasm of colon: Secondary | ICD-10-CM

## 2021-07-11 ENCOUNTER — Encounter: Payer: Self-pay | Admitting: Gastroenterology

## 2021-08-17 ENCOUNTER — Other Ambulatory Visit (HOSPITAL_COMMUNITY)
Admission: RE | Admit: 2021-08-17 | Discharge: 2021-08-17 | Disposition: A | Discharge status: Home / Self Care | Payer: BC Managed Care – PPO | Source: Ambulatory Visit | Attending: Family Medicine | Admitting: Family Medicine

## 2021-08-17 ENCOUNTER — Ambulatory Visit (INDEPENDENT_AMBULATORY_CARE_PROVIDER_SITE_OTHER): Payer: BC Managed Care – PPO | Admitting: Family Medicine

## 2021-08-17 ENCOUNTER — Encounter: Payer: Self-pay | Admitting: Family Medicine

## 2021-08-17 VITALS — BP 102/70 | HR 80 | Temp 97.1°F | Ht 67.75 in | Wt 192.3 lb

## 2021-08-17 DIAGNOSIS — E559 Vitamin D deficiency, unspecified: Secondary | ICD-10-CM | POA: Diagnosis not present

## 2021-08-17 DIAGNOSIS — Z13220 Encounter for screening for lipoid disorders: Secondary | ICD-10-CM | POA: Diagnosis not present

## 2021-08-17 DIAGNOSIS — Z23 Encounter for immunization: Secondary | ICD-10-CM | POA: Diagnosis not present

## 2021-08-17 DIAGNOSIS — R5383 Other fatigue: Secondary | ICD-10-CM | POA: Diagnosis not present

## 2021-08-17 DIAGNOSIS — Z0000 Encounter for general adult medical examination without abnormal findings: Secondary | ICD-10-CM | POA: Diagnosis not present

## 2021-08-17 DIAGNOSIS — E538 Deficiency of other specified B group vitamins: Secondary | ICD-10-CM

## 2021-08-17 DIAGNOSIS — Z124 Encounter for screening for malignant neoplasm of cervix: Secondary | ICD-10-CM | POA: Diagnosis not present

## 2021-08-17 DIAGNOSIS — M24273 Disorder of ligament, unspecified ankle: Secondary | ICD-10-CM

## 2021-08-17 DIAGNOSIS — R252 Cramp and spasm: Secondary | ICD-10-CM

## 2021-08-17 DIAGNOSIS — R7301 Impaired fasting glucose: Secondary | ICD-10-CM

## 2021-08-17 NOTE — Addendum Note (Signed)
Addended by: Lahoma Crocker A on: 08/17/2021 02:00 PM ? ? Modules accepted: Orders ? ?

## 2021-08-17 NOTE — Progress Notes (Signed)
Amy Odonnell ?DOB: [birth year] ?Encounter date: 08/17/2021 ? ?This is a 51 y.o. female who presents for complete physical  ? ?History of present illness/Additional concerns: ?Last visit with me was 02/27/2020 to establish care. ? ?Does feel like GI issues are worse at this point. She has done a lot of reading and tracking and just really has liquid stool all the time. Doesn't feel like fiber changes this. Doesn't matter what else she is eating. Is very bloated and does have a lot of gas, no pain. Is bleeding all the time now; suspects large internal hemorrhoid. ? ?Last pap was at least 8 years ago. Not sexually active.  ? ?She is tired a lot. Stress level much better; has had job change and this has been great for her. Not feeling that she needs to sleep, just that she has to lay down. Not tired like needing sleep, just lays down. Then she can get back up and doing things. She took vitamin d and b12 after last labwork but didn't note difference. Restarted beginning of march and does now feel like those helped some. Does feel like she is eating less healthy to try and keep herself going.  ? ?No regular acid reflux.  ? ? ?Past Medical History:  ?Diagnosis Date  ? Asthma   ? Migraine   ? Seasonal allergies   ? ?Past Surgical History:  ?Procedure Laterality Date  ? APPENDECTOMY    ? ?Allergies  ?Allergen Reactions  ? Codeine Hives  ? ?Current Meds  ?Medication Sig  ? albuterol (VENTOLIN HFA) 108 (90 Base) MCG/ACT inhaler Inhale 2 puffs into the lungs every 6 (six) hours as needed for wheezing or shortness of breath.  ? COVID-19 mRNA vaccine, Moderna, 100 MCG/0.5ML injection Inject into the muscle.  ? norgestimate-ethinyl estradiol (ORTHO-CYCLEN) 0.25-35 MG-MCG tablet TAKE 1 TABLET BY MOUTH EVERY DAY  ? Spacer/Aero-Holding Chambers (AEROCHAMBER PLUS) inhaler Use as instructed  ? valACYclovir (VALTREX) 500 MG tablet Take 1 tablet (500 mg total) by mouth 2 (two) times daily.  ? ?Social History  ? ?Tobacco Use  ? Smoking  status: Never  ? Smokeless tobacco: Never  ?Substance Use Topics  ? Alcohol use: Yes  ?  Comment: Social   ? ?Family History  ?Problem Relation Age of Onset  ? Asthma Mother   ? Hypothyroidism Mother   ? Obesity Mother   ? Depression Mother   ? Arthritis Mother   ? Diabetes Maternal Grandmother   ? Hypertension Maternal Grandmother   ? Hyperlipidemia Father   ? Other Father   ?     hyperglycemia  ? Depression Sister   ? ? ? ?Review of Systems  ?Constitutional:  Negative for activity change, appetite change, chills, fatigue, fever and unexpected weight change.  ?HENT:  Negative for congestion, ear pain, hearing loss, sinus pressure, sinus pain, sore throat and trouble swallowing.   ?Eyes:  Negative for pain and visual disturbance.  ?Respiratory:  Negative for cough, chest tightness, shortness of breath and wheezing.   ?Cardiovascular:  Negative for chest pain, palpitations and leg swelling.  ?Gastrointestinal:  Negative for abdominal pain, blood in stool, constipation, diarrhea, nausea and vomiting.  ?Genitourinary:  Negative for difficulty urinating and menstrual problem.  ?Musculoskeletal:  Negative for arthralgias and back pain.  ?Skin:  Negative for rash.  ?Neurological:  Negative for dizziness, weakness, numbness and headaches.  ?Hematological:  Negative for adenopathy. Does not bruise/bleed easily.  ?Psychiatric/Behavioral:  Negative for sleep disturbance and suicidal ideas. The patient is  not nervous/anxious.   ? ?CBC:  ?Lab Results  ?Component Value Date  ? WBC 8.7 03/02/2020  ? HGB 12.5 03/02/2020  ? HCT 37.6 03/02/2020  ? MCH 29.9 03/02/2020  ? MCHC 33.2 03/02/2020  ? RDW 12.4 03/02/2020  ? PLT 361 03/02/2020  ? MPV 10.6 03/02/2020  ? MPV 8.8 02/22/2012  ? ?CMP: ?Lab Results  ?Component Value Date  ? NA 136 03/02/2020  ? K 4.5 03/02/2020  ? CL 106 03/02/2020  ? CO2 21 03/02/2020  ? GLUCOSE 87 03/02/2020  ? BUN 11 03/02/2020  ? CREATININE 0.72 03/02/2020  ? CALCIUM 8.6 03/02/2020  ? PROT 6.5 03/02/2020  ?  BILITOT 0.4 03/02/2020  ? ALKPHOS 51 01/02/2019  ? ALT 6 03/02/2020  ? AST 10 03/02/2020  ? ?LIPID: ?Lab Results  ?Component Value Date  ? CHOL 196 03/02/2020  ? TRIG 134 03/02/2020  ? HDL 57 03/02/2020  ? LDLCALC 114 (H) 03/02/2020  ? ? ?Objective: ? ?BP 102/70 (BP Location: Left Arm, Patient Position: Sitting, Cuff Size: Large)   Pulse 80   Temp (!) 97.1 ?F (36.2 ?C) (Axillary)   Ht 5' 7.75" (1.721 m)   Wt 192 lb 4.8 oz (87.2 kg)   LMP 07/23/2021 (Approximate)   SpO2 99%   BMI 29.46 kg/m?   Weight: 192 lb 4.8 oz (87.2 kg)  ? ?BP Readings from Last 3 Encounters:  ?08/17/21 102/70  ?02/27/20 116/78  ?01/31/19 126/78  ? ?Wt Readings from Last 3 Encounters:  ?08/17/21 192 lb 4.8 oz (87.2 kg)  ?02/27/20 174 lb 6.4 oz (79.1 kg)  ?01/31/19 181 lb (82.1 kg)  ? ? ?Physical Exam ?Constitutional:   ?   General: She is not in acute distress. ?   Appearance: She is well-developed.  ?HENT:  ?   Head: Normocephalic and atraumatic.  ?   Right Ear: External ear normal.  ?   Left Ear: External ear normal.  ?   Mouth/Throat:  ?   Pharynx: No oropharyngeal exudate.  ?Eyes:  ?   Conjunctiva/sclera: Conjunctivae normal.  ?   Pupils: Pupils are equal, round, and reactive to light.  ?Neck:  ?   Thyroid: No thyromegaly.  ?Cardiovascular:  ?   Rate and Rhythm: Normal rate and regular rhythm.  ?   Heart sounds: Normal heart sounds. No murmur heard. ?  No friction rub. No gallop.  ?Pulmonary:  ?   Effort: Pulmonary effort is normal.  ?   Breath sounds: Normal breath sounds.  ?Abdominal:  ?   General: Bowel sounds are normal. There is no distension.  ?   Palpations: Abdomen is soft. There is no mass.  ?   Tenderness: There is no abdominal tenderness. There is no guarding.  ?   Hernia: No hernia is present.  ?Genitourinary: ?   Exam position: Supine.  ?   Vagina: Normal.  ?   Cervix: Normal.  ?   Uterus: Normal.   ?   Adnexa: Right adnexa normal and left adnexa normal.  ?   Comments: Cervix is closed.  ?Musculoskeletal:     ?   General:  No tenderness or deformity. Normal range of motion.  ?   Cervical back: Normal range of motion and neck supple.  ?Lymphadenopathy:  ?   Cervical: No cervical adenopathy.  ?Skin: ?   General: Skin is warm and dry.  ?   Findings: No rash.  ?Neurological:  ?   Mental Status: She is alert and oriented to  person, place, and time.  ?   Deep Tendon Reflexes: Reflexes normal.  ?   Reflex Scores: ?     Tricep reflexes are 2+ on the right side and 2+ on the left side. ?     Bicep reflexes are 2+ on the right side and 2+ on the left side. ?     Brachioradialis reflexes are 2+ on the right side and 2+ on the left side. ?     Patellar reflexes are 2+ on the right side and 2+ on the left side. ?Psychiatric:     ?   Speech: Speech normal.     ?   Behavior: Behavior normal.     ?   Thought Content: Thought content normal.  ? ? ?Assessment/Plan: ?Health Maintenance Due  ?Topic Date Due  ? PAP SMEAR-Modifier  05/22/2017  ? COVID-19 Vaccine (1) 02/16/2021  ? MAMMOGRAM  Never done  ? Zoster Vaccines- Shingrix (1 of 2) Never done  ? ?Health Maintenance reviewed - will catch up with preventative needs today. ? ?1. Preventative health care ?Work on regular exercise.  ?- MM Digital Screening; Future ? ?2. Fatigue, unspecified type ?Start with bloodwork; will keep follow up with GI.  ?- CBC with Differential/Platelet; Future ?- Comprehensive metabolic panel; Future ?- TSH; Future ? ?3. Vitamin D deficiency ?- VITAMIN D 25 Hydroxy (Vit-D Deficiency, Fractures); Future ? ?4. Lipid screening ?- Lipid panel; Future ? ?5. Elevated fasting glucose ?- Hemoglobin A1c; Future ? ?6. Muscle cramps ?- Magnesium, RBC; Future ? ?7. Low serum vitamin B12 ?- Vitamin B12; Future ?- Folate; Future ?- Homocysteine; Future ?- Methylmalonic acid, serum; Future ? ? ?Return in about 1 year (around 08/18/2022) for physical exam. ? ?Micheline Rough, MD ? ? ? ? ? ?

## 2021-08-17 NOTE — Addendum Note (Signed)
Addended by: Agnes Lawrence on: 08/17/2021 01:51 PM ? ? Modules accepted: Orders ? ?

## 2021-08-17 NOTE — Addendum Note (Signed)
Addended by: Caren Macadam on: 08/17/2021 04:55 PM ? ? Modules accepted: Orders ? ?

## 2021-08-19 LAB — CYTOLOGY - PAP
Comment: NEGATIVE
Diagnosis: NEGATIVE
High risk HPV: NEGATIVE

## 2021-08-22 ENCOUNTER — Other Ambulatory Visit: Payer: Self-pay

## 2021-08-22 ENCOUNTER — Ambulatory Visit (AMBULATORY_SURGERY_CENTER): Payer: BC Managed Care – PPO | Admitting: *Deleted

## 2021-08-22 VITALS — Ht 67.0 in | Wt 190.0 lb

## 2021-08-22 DIAGNOSIS — Z1211 Encounter for screening for malignant neoplasm of colon: Secondary | ICD-10-CM

## 2021-08-22 MED ORDER — PEG 3350-KCL-NA BICARB-NACL 420 G PO SOLR: 4000.0000 mL | Freq: Once | ORAL | 0 refills | Status: AC

## 2021-08-22 NOTE — Progress Notes (Signed)

## 2021-08-23 ENCOUNTER — Other Ambulatory Visit (INDEPENDENT_AMBULATORY_CARE_PROVIDER_SITE_OTHER): Payer: BC Managed Care – PPO

## 2021-08-23 ENCOUNTER — Encounter: Payer: Self-pay | Admitting: Physical Therapy

## 2021-08-23 ENCOUNTER — Ambulatory Visit: Payer: BC Managed Care – PPO | Admitting: Physical Therapy

## 2021-08-23 DIAGNOSIS — Z13220 Encounter for screening for lipoid disorders: Secondary | ICD-10-CM | POA: Diagnosis not present

## 2021-08-23 DIAGNOSIS — M25571 Pain in right ankle and joints of right foot: Secondary | ICD-10-CM | POA: Diagnosis not present

## 2021-08-23 DIAGNOSIS — M6281 Muscle weakness (generalized): Secondary | ICD-10-CM

## 2021-08-23 DIAGNOSIS — R7301 Impaired fasting glucose: Secondary | ICD-10-CM

## 2021-08-23 DIAGNOSIS — E538 Deficiency of other specified B group vitamins: Secondary | ICD-10-CM

## 2021-08-23 DIAGNOSIS — R5383 Other fatigue: Secondary | ICD-10-CM

## 2021-08-23 DIAGNOSIS — R252 Cramp and spasm: Secondary | ICD-10-CM

## 2021-08-23 LAB — CBC WITH DIFFERENTIAL/PLATELET
Basophils Absolute: 0 10*3/uL (ref 0.0–0.1)
Basophils Relative: 0.5 % (ref 0.0–3.0)
Eosinophils Absolute: 0.2 10*3/uL (ref 0.0–0.7)
Eosinophils Relative: 2.5 % (ref 0.0–5.0)
HCT: 38 % (ref 36.0–46.0)
Hemoglobin: 12.7 g/dL (ref 12.0–15.0)
Lymphocytes Relative: 20.5 % (ref 12.0–46.0)
Lymphs Abs: 1.7 10*3/uL (ref 0.7–4.0)
MCHC: 33.5 g/dL (ref 30.0–36.0)
MCV: 88.7 fl (ref 78.0–100.0)
Monocytes Absolute: 0.5 10*3/uL (ref 0.1–1.0)
Monocytes Relative: 5.5 % (ref 3.0–12.0)
Neutro Abs: 6 10*3/uL (ref 1.4–7.7)
Neutrophils Relative %: 71 % (ref 43.0–77.0)
Platelets: 356 10*3/uL (ref 150.0–400.0)
RBC: 4.29 Mil/uL (ref 3.87–5.11)
RDW: 13.1 % (ref 11.5–15.5)
WBC: 8.4 10*3/uL (ref 4.0–10.5)

## 2021-08-23 LAB — COMPREHENSIVE METABOLIC PANEL
ALT: 9 U/L (ref 0–35)
AST: 14 U/L (ref 0–37)
Albumin: 4.1 g/dL (ref 3.5–5.2)
Alkaline Phosphatase: 39 U/L (ref 39–117)
BUN: 11 mg/dL (ref 6–23)
CO2: 26 mEq/L (ref 19–32)
Calcium: 9 mg/dL (ref 8.4–10.5)
Chloride: 101 mEq/L (ref 96–112)
Creatinine, Ser: 0.83 mg/dL (ref 0.40–1.20)
GFR: 82.37 mL/min (ref >60.00)
Glucose, Bld: 91 mg/dL (ref 70–99)
Potassium: 4 mEq/L (ref 3.5–5.1)
Sodium: 135 mEq/L (ref 135–145)
Total Bilirubin: 0.4 mg/dL (ref 0.2–1.2)
Total Protein: 6.6 g/dL (ref 6.0–8.3)

## 2021-08-23 LAB — LIPID PANEL
Cholesterol: 212 mg/dL — ABNORMAL HIGH (ref 0–200)
HDL: 52.5 mg/dL (ref >39.00)
LDL Cholesterol: 128 mg/dL — ABNORMAL HIGH (ref 0–99)
NonHDL: 159.63
Total CHOL/HDL Ratio: 4
Triglycerides: 159 mg/dL — ABNORMAL HIGH (ref 0.0–149.0)
VLDL: 31.8 mg/dL (ref 0.0–40.0)

## 2021-08-23 LAB — FOLATE: Folate: 21.7 ng/mL (ref >5.9)

## 2021-08-23 LAB — HEMOGLOBIN A1C: Hgb A1c MFr Bld: 5.5 % (ref 4.6–6.5)

## 2021-08-23 LAB — VITAMIN B12: Vitamin B-12: 747 pg/mL (ref 211–911)

## 2021-08-23 LAB — TSH: TSH: 2.27 u[IU]/mL (ref 0.35–5.50)

## 2021-08-23 NOTE — Therapy (Signed)
?OUTPATIENT PHYSICAL THERAPY LOWER EXTREMITY EVALUATION ? ? ?Patient Name: Amy Odonnell ?MRN: [id number] ?DOB:[birth year], 51 y.o., female ?Today's Date: 08/23/2021 ? ? PT End of Session - 08/23/21 1340   ? ? Visit Number 1   ? Number of Visits 12   ? Date for PT Re-Evaluation 10/04/21   ? Authorization Type BCBS   ? PT Start Time 1215   ? PT Stop Time 1247   ? PT Time Calculation (min) 32 min   ? Activity Tolerance Patient tolerated treatment well   ? Behavior During Therapy [facility] for tasks assessed/performed   ? ?  ?  ? ?  ? ? ?Past Medical History:  ?Diagnosis Date  ? Allergy   ? SEASONAL  ? Asthma   ? Migraine   ? Seasonal allergies   ? ?Past Surgical History:  ?Procedure Laterality Date  ? APPENDECTOMY    ? ?Patient Active Problem List  ? Diagnosis Date Noted  ? Palpitations 01/31/2019  ? 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 ? ?PCP: Caren Macadam, MD ? ?REFERRING PROVIDER: Caren Macadam, MD ? ?REFERRING DIAG: Instability of ankle ? ?THERAPY DIAG:  ?Muscle weakness (generalized) ? ?Pain in right ankle and joints of right foot ? ?ONSET DATE:  ? ?SUBJECTIVE:  ? ?SUBJECTIVE STATEMENT: ?Pt states instability in bil ankles. She has had multiple instances of rolling of ankles since high school. Did recently have several more in the Fall this year. Wants to strengthen to avoid possible falls or injury in the future.  ?Reports mild soreness if ankle are in Inversion position. Wearing [employer] shoes, does stand at work some days, also works from home some. Does not currently do any strengthening or ankle exercises.   ?  ?PERTINENT HISTORY: ? ? ?PAIN:  ?Are you having pain? Yes: NPRS scale: 3/10 ?Pain location: lateral ankles ?Pain description: mild/achey with inversion ?Aggravating factors: sleeping, inversion ?Relieving factors: none, position change. ? ?PRECAUTIONS: None ? ?WEIGHT BEARING RESTRICTIONS No ? ?FALLS:  ?Has patient fallen in last 6 months? No ? ?PLOF: Independent ? ?PATIENT GOALS  improve stability in  bil ankles, for less "rolling" and to prevent falls in future.  ? ? ?OBJECTIVE:  ? ?DIAGNOSTIC FINDINGS: none- unknown condition of ankle ligaments.  ? ?COGNITION: ? Overall cognitive status: Within functional limits for tasks assessed   ? ?POSTURE:  ?Standing foot posture: mild/moderate pronation ? ?PALPATION: ?Mild hypomobility for DF bil;  hypermobile inv/ev bil;  ? ?LE ROM: ?  Hips: WNL, Knee: WNL,  Ankle: mild hypomobility for DF bil;  ? ?LE MMT: ?  Hips: 4/5,  Knee: 5/5,  Ankle : inv/ev 4/5  ? ? ?LOWER EXTREMITY SPECIAL TESTS:  ? ? ?GAIT: ? unremarkable ? ? ?TODAY'S TREATMENT: ?08/23/21: Eval:  ?Therapeutic Exercise: ?Aerobic: ?Supine: Bridging x 10; S/L hip abd x10 bil;  ?Seated: ?Standing: Heel raises x 20 with ball; SLS 20 sec x 3 bil;  ?Stretches: Gastroc 2 way at wall, soleus, each x 2 min bil;  ?Neuromuscular Re-education: ?Manual Therapy: ? ? ?PATIENT EDUCATION:  ?Education details: PT POC, Exam findings, HEP ?Person educated: Patient ?Education method: Explanation, Demonstration, Tactile cues, Verbal cues, and Handouts ?Education comprehension: verbalized understanding, returned demonstration, verbal cues required, tactile cues required, and needs further education ? ? ?HOME EXERCISE PROGRAM: ?Access Code: [email or web address] ?URL: [email or web address] ?Date: 08/23/2021 ?Prepared by: Lyndee Hensen ? ?Exercises ?- Heel Raises with Counter Support  - 1-2 x daily - 7 x weekly - 2 sets - 10 reps ?- Single Leg  Stance  - 2 x daily - 3 sets - 3 reps - 30 hold ?- Supine Bridge  - 1 x daily - 4 x weekly - 2 sets - 10 reps ?- Sidelying Hip Abduction  - 1 x daily - 4 x weekly - 2 sets - 10 reps ?- Gastroc Stretch on Wall  - 2 x daily - 3 reps - 30 hold ?- Standing Soleus Stretch  - 2 x daily - 3 reps - 30 hold ? ? ? ?ASSESSMENT: ? ?CLINICAL IMPRESSION: ?Pt presents with primary complaint of instability in bil ankles. She has had multiple sprains and is hoping to strengthen ankle and reduce injury in the  future. Pt with some mobility deficits for DF, and does have hypermobility for inv/ev. She will benefit from education on hip and ankle strengthening, as well as higher level stability and balance exercises to improve safety and ability for functional activity.  ? ? ?OBJECTIVE IMPAIRMENTS decreased activity tolerance, decreased balance, decreased coordination, decreased ROM, decreased strength, impaired flexibility, improper body mechanics, and pain.  ? ?ACTIVITY LIMITATIONS cleaning, community activity, yard work, and shopping.  ? ?PERSONAL FACTORS  none  are also affecting patient's functional outcome.  ? ? ?REHAB POTENTIAL: Good ? ?CLINICAL DECISION MAKING: Stable/uncomplicated ? ?EVALUATION COMPLEXITY: Low ? ? ?GOALS: ?Goals reviewed with patient? Yes ? ?SHORT TERM GOALS: Target date: 09/06/2021 ? ?Pt to be independent with initial HEP  ? ?Goal status: INITIAL ? ?2.  Pt to demo full heel raise with mechanics WNL bil; ?Goal status: INITIAL ? ? ?LONG TERM GOALS: Target date: 10/04/2021 ? ?Pt to be independent with final HEP ? ?Goal status: INITIAL ? ?2.  Pt to demo ability for SLS and dynamic movement with stability of bil ankles to be WNL.  ? ?Goal status: INITIAL ? ?3.  Pt to demo ability for step ups and dynamic movement on unstable surface , to be WNL, for improved stability and safety with bil ankles.  ? ?Goal status: INITIAL ? ? ? ? ?PLAN: ?PT FREQUENCY: 1-2x/week ? ?PT DURATION: 6 weeks ? ?PLANNED INTERVENTIONS: Therapeutic exercises, Therapeutic activity, Neuromuscular re-education, Balance training, Gait training, Patient/Family education, Joint manipulation, Joint mobilization, Stair training, DME instructions, Dry Needling, Electrical stimulation, Spinal manipulation, Spinal mobilization, Cryotherapy, Moist heat, Taping, Traction, Ultrasound, Ionotophoresis '4mg'$ /ml Dexamethasone, and Manual therapy ? ?PLAN FOR NEXT SESSION:  ? ?Lyndee Hensen, PT, DPT ?1:41 PM  08/23/21 ? ? ?

## 2021-08-24 ENCOUNTER — Encounter: Payer: Self-pay | Admitting: Gastroenterology

## 2021-08-26 LAB — HOMOCYSTEINE: Homocysteine: 7.1 umol/L (ref <10.4)

## 2021-08-26 LAB — METHYLMALONIC ACID, SERUM: Methylmalonic Acid, Quant: 104 nmol/L (ref 87–318)

## 2021-08-26 LAB — MAGNESIUM, RBC: Magnesium RBC: 5.5 mg/dL (ref 4.0–6.4)

## 2021-09-05 ENCOUNTER — Encounter: Payer: Self-pay | Admitting: Gastroenterology

## 2021-09-05 ENCOUNTER — Ambulatory Visit (AMBULATORY_SURGERY_CENTER): Payer: BC Managed Care – PPO | Admitting: Gastroenterology

## 2021-09-05 VITALS — BP 104/69 | HR 76 | Temp 98.4°F | Resp 14 | Ht 67.0 in | Wt 190.0 lb

## 2021-09-05 DIAGNOSIS — D375 Neoplasm of uncertain behavior of rectum: Secondary | ICD-10-CM

## 2021-09-05 DIAGNOSIS — K6289 Other specified diseases of anus and rectum: Secondary | ICD-10-CM

## 2021-09-05 DIAGNOSIS — Z1211 Encounter for screening for malignant neoplasm of colon: Secondary | ICD-10-CM

## 2021-09-05 MED ORDER — SODIUM CHLORIDE 0.9 % IV SOLN: 500.0000 mL | Freq: Once | INTRAVENOUS | Status: DC

## 2021-09-05 NOTE — Patient Instructions (Signed)
Discharge instructions given. ?Biopsies taken. ?Contrast given in recovery. ?Office will call to schedule CT Scan of abdomen with contrast and pelvis. ?Resume previous medications. ?YOU HAD AN ENDOSCOPIC PROCEDURE TODAY AT [facility]:   Refer to the procedure report that was given to you for any specific questions about what was found during the examination.  If the procedure report does not answer your questions, please call your gastroenterologist to clarify.  If you requested that your care partner not be given the details of your procedure findings, then the procedure report has been included in a sealed envelope for you to review at your convenience later. ? ?YOU SHOULD EXPECT: Some feelings of bloating in the abdomen. Passage of more gas than usual.  Walking can help get rid of the air that was put into your GI tract during the procedure and reduce the bloating. If you had a lower endoscopy (such as a colonoscopy or flexible sigmoidoscopy) you may notice spotting of blood in your stool or on the toilet paper. If you underwent a bowel prep for your procedure, you may not have a normal bowel movement for a few days. ? ?Please Note:  You might notice some irritation and congestion in your nose or some drainage.  This is from the oxygen used during your procedure.  There is no need for concern and it should clear up in a day or so. ? ?SYMPTOMS TO REPORT IMMEDIATELY: ? ?Following lower endoscopy (colonoscopy or flexible sigmoidoscopy): ? Excessive amounts of blood in the stool ? Significant tenderness or worsening of abdominal pains ? Swelling of the abdomen that is new, acute ? Fever of 100?F or higher ? ? ?For urgent or emergent issues, a gastroenterologist can be reached at any hour by calling [phone]. ?Do not use MyChart messaging for urgent concerns.  ? ? ?DIET:  We do recommend a small meal at first, but then you may proceed to your regular diet.  Drink plenty of fluids but you should  avoid alcoholic beverages for 24 hours. ? ?ACTIVITY:  You should plan to take it easy for the rest of today and you should NOT DRIVE or use heavy machinery until tomorrow (because of the sedation medicines used during the test).   ? ?FOLLOW UP: ?Our staff will call the number listed on your records 48-72 hours following your procedure to check on you and address any questions or concerns that you may have regarding the information given to you following your procedure. If we do not reach you, we will leave a message.  We will attempt to reach you two times.  During this call, we will ask if you have developed any symptoms of COVID 19. If you develop any symptoms (ie: fever, flu-like symptoms, shortness of breath, cough etc.) before then, please call [phone].  If you test positive for Covid 19 in the 2 weeks post procedure, please call and report this information to [place].   ? ?If any biopsies were taken you will be contacted by phone or by letter within the next 1-3 weeks.  Please call us at [phone] if you have not heard about the biopsies in 3 weeks.  ? ? ?SIGNATURES/CONFIDENTIALITY: ?You and/or your care partner have signed paperwork which will be entered into your electronic medical record.  These signatures attest to the fact that that the information above on your After Visit Summary has been reviewed and is understood.  Full responsibility of the confidentiality of this discharge information  lies with you and/or your care-partner.  ?

## 2021-09-05 NOTE — Progress Notes (Signed)
PT taken to PACU. Monitors in place. VSS. Report given to RN. 

## 2021-09-05 NOTE — Progress Notes (Signed)
Called to room to assist during endoscopic procedure.  Patient ID and intended procedure confirmed with present staff. Received instructions for my participation in the procedure from the performing physician.  

## 2021-09-05 NOTE — Op Note (Addendum)
[place] ?Patient Name: Amy Odonnell ?Procedure Date: 09/05/2021 10:43 AM ?MRN: [id number] ?Endoscopist: Arland Usery E. Candis Schatz , MD ?Age: 51 ?Referring MD:  ?Date of Birth: Dec 25, 1970 ?Gender: Female ?Account #: [id number] ?Procedure:                Colonoscopy ?Indications:              Screening for colorectal malignant neoplasm, This  ?                          is the patient's first colonoscopy ?Medicines:                Monitored Anesthesia Care ?Procedure:                Pre-Anesthesia Assessment: ?                          - Prior to the procedure, a History and Physical  ?                          was performed, and patient medications and  ?                          allergies were reviewed. The patient's tolerance of  ?                          previous anesthesia was also reviewed. The risks  ?                          and benefits of the procedure and the sedation  ?                          options and risks were discussed with the patient.  ?                          All questions were answered, and informed consent  ?                          was obtained. Prior Anticoagulants: The patient has  ?                          taken no previous anticoagulant or antiplatelet  ?                          agents. ASA Grade Assessment: II - A patient with  ?                          mild systemic disease. After reviewing the risks  ?                          and benefits, the patient was deemed in  ?                          satisfactory condition to undergo the procedure. ?  After obtaining informed consent, the colonoscope  ?                          was passed under direct vision. Throughout the  ?                          procedure, the patient's blood pressure, pulse, and  ?                          oxygen saturations were monitored continuously. The  ?                          Olympus CF-[id number] (#[id number]) Colonoscope was  ?                          introduced through the  anus and advanced to the the  ?                          terminal ileum, with identification of the  ?                          appendiceal orifice and IC valve. The colonoscopy  ?                          was performed without difficulty. The patient  ?                          tolerated the procedure well. The quality of the  ?                          bowel preparation was good. The terminal ileum,  ?                          ileocecal valve, appendiceal orifice, and rectum  ?                          were photographed. The bowel preparation used was  ?                          GoLYTELY via split dose instruction. ?Scope In: 11:06:28 AM ?Scope Out: 11:27:17 AM ?Scope Withdrawal Time: 0 hours 12 minutes 13 seconds  ?Total Procedure Duration: 0 hours 20 minutes 49 seconds  ?Findings:                 The perianal and digital rectal examinations were  ?                          normal. Pertinent negatives include normal  ?                          sphincter tone and no palpable rectal lesions. ?                          A polypoid partially obstructing large mass was  ?  found in the proximal rectum (about 10 cm from the  ?                          anal verge). The mass was partially circumferential  ?                          (involving one-third of the lumen circumference)  ?                          with a broad base. The mass measured approximately  ?                          4 cm in length. No bleeding was present. This was  ?                          biopsied (8 total biopsies) with a cold forceps for  ?                          histology. The lesion was soft and friable.  ?                          Estimated blood loss was minimal. ?                          The exam was otherwise normal throughout the  ?                          examined colon. ?                          The terminal ileum appeared normal. ?                          The retroflexed view of the distal rectum and anal  ?                           verge was normal and showed no anal or rectal  ?                          abnormalities. ?Complications:            No immediate complications. ?Estimated Blood Loss:     Estimated blood loss was minimal. ?Impression:               - Rule out malignancy, partially obstructing tumor  ?                          in the proximal rectum. Biopsied. This lesion may  ?                          be endoscopically resectable, but would need to be  ?                          done in the hospital setting. ?                          -  The examined portion of the ileum was normal. ?                          - The distal rectum and anal verge are normal on  ?                          retroflexion view. ?Recommendation:           - Patient has a contact number available for  ?                          emergencies. The signs and symptoms of potential  ?                          delayed complications were discussed with the  ?                          patient. Return to normal activities tomorrow.  ?                          Written discharge instructions were provided to the  ?                          patient. ?                          - Resume previous diet. ?                          - Continue present medications. ?                          - Await pathology results. ?                          - Further recommendations will be based on  ?                          pathology results ?                          - Perform a CT scan (computed tomography) of  ?                          abdomen with contrast and pelvis with contrast at  ?                          appointment to be scheduled. ?Jerni Selmer E. Candis Schatz, MD ?09/05/2021 11:40:24 AM ?This report has been signed electronically. ?

## 2021-09-05 NOTE — Progress Notes (Signed)
Pt's states no medical or surgical changes since previsit or office visit. 

## 2021-09-05 NOTE — Progress Notes (Signed)
[facility] History and Physical ? ? ?Primary Care Physician:  Caren Macadam, MD ? ? ?Reason for Procedure:   Colon cancer screening ? ?Plan:    Screening colonoscopy ? ? ? ? ?HPI: Amy Odonnell is a 51 y.o. female undergoing initial average risk screening colonoscopy.  She has no family history of colon cancer and no chronic GI symptoms.  ? ? ?Past Medical History:  ?Diagnosis Date  ? Allergy   ? SEASONAL  ? Asthma   ? Migraine   ? Seasonal allergies   ? ? ?Past Surgical History:  ?Procedure Laterality Date  ? APPENDECTOMY    ? ? ?Prior to Admission medications   ?Medication Sig Start Date End Date Taking? Authorizing Provider  ?Cholecalciferol (VITAMIN D3 PO) Take by mouth daily. 2000 iu   Yes [provider]  ?Cyanocobalamin (VITAMIN B12 PO) Take by mouth daily.   Yes [provider]  ?norgestimate-ethinyl estradiol (ORTHO-CYCLEN) 0.25-35 MG-MCG tablet TAKE 1 TABLET BY MOUTH EVERY DAY 04/25/21  Yes Caren Macadam, MD  ?Spacer/Aero-Holding Chambers (AEROCHAMBER PLUS) inhaler Use as instructed 11/25/20  Yes Koberlein, Junell C, MD  ?albuterol (VENTOLIN HFA) 108 (90 Base) MCG/ACT inhaler Inhale 2 puffs into the lungs every 6 (six) hours as needed for wheezing or shortness of breath. ?Patient not taking: Reported on 08/22/2021 11/25/20   Caren Macadam, MD  ?valACYclovir (VALTREX) 500 MG tablet Take 1 tablet (500 mg total) by mouth 2 (two) times daily. ?Patient not taking: Reported on 08/22/2021 05/27/20   Caren Macadam, MD  ? ? ?Current Outpatient Medications  ?Medication Sig Dispense Refill  ? Cholecalciferol (VITAMIN D3 PO) Take by mouth daily. 2000 iu    ? Cyanocobalamin (VITAMIN B12 PO) Take by mouth daily.    ? norgestimate-ethinyl estradiol (ORTHO-CYCLEN) 0.25-35 MG-MCG tablet TAKE 1 TABLET BY MOUTH EVERY DAY 84 tablet 3  ? Spacer/Aero-Holding Chambers (AEROCHAMBER PLUS) inhaler Use as instructed 1 each 0  ? albuterol (VENTOLIN HFA) 108 (90 Base) MCG/ACT inhaler Inhale  2 puffs into the lungs every 6 (six) hours as needed for wheezing or shortness of breath. (Patient not taking: Reported on 08/22/2021) 8 g 0  ? valACYclovir (VALTREX) 500 MG tablet Take 1 tablet (500 mg total) by mouth 2 (two) times daily. (Patient not taking: Reported on 08/22/2021) 60 tablet 5  ? ?Current Facility-Administered Medications  ?Medication Dose Route Frequency Provider Last Rate Last Admin  ? 0.9 %  sodium chloride infusion  500 mL Intravenous Once Daryel November, MD      ? ? ?Allergies as of 09/05/2021 - Review Complete 09/05/2021  ?Allergen Reaction Noted  ? Codeine Hives 04/14/2011  ? ? ?Family History  ?Problem Relation Age of Onset  ? Asthma Mother   ? Hypothyroidism Mother   ? Obesity Mother   ? Depression Mother   ? Arthritis Mother   ? Hyperlipidemia Father   ? Other Father   ?     hyperglycemia  ? Depression Sister   ? Diabetes Maternal Grandmother   ? Hypertension Maternal Grandmother   ? Colon cancer Neg Hx   ? Colon polyps Neg Hx   ? Crohn's disease Neg Hx   ? Esophageal cancer Neg Hx   ? Rectal cancer Neg Hx   ? Stomach cancer Neg Hx   ? ? ?Social History  ? ?Socioeconomic History  ? Marital status: Single  ?  Spouse name: Not on file  ? Number of children: Not on file  ?  Years of education: Not on file  ? Highest education level: Not on file  ?Occupational History  ? Occupation: Designer, jewellery  ?Tobacco Use  ? Smoking status: Never  ?  Passive exposure: Never  ? Smokeless tobacco: Never  ?Vaping Use  ? Vaping Use: Never used  ?Substance and Sexual Activity  ? Alcohol use: Yes  ?  Comment: Social   ? Drug use: No  ? Sexual activity: Yes  ?Other Topics Concern  ? Not on file  ?Social History Narrative  ? Finished PHD in Nursing in 09/2016. States that she gained about 30-45 pounds during that time. Not exercising.   ? Single, 2 adopted children - ages 41 and 4 - in [place]. 04/02/2017   ? ?Social Determinants of Health  ? ?Financial Resource Strain: Not on file  ?Food Insecurity:  Not on file  ?Transportation Needs: Not on file  ?Physical Activity: Not on file  ?Stress: Not on file  ?Social Connections: Not on file  ?Intimate Partner Violence: Not on file  ? ? ?Review of Systems: ? ?All other review of systems negative except as mentioned in the HPI. ? ?Physical Exam: ?Vital signs ?BP (!) 103/57   Pulse 79   Temp 98.4 ?F (36.9 ?C) (Skin)   Ht '5\' 7"'$  (1.702 m)   Wt 190 lb (86.2 kg)   SpO2 99%   BMI 29.76 kg/m?  ? ?General:   Alert,  Well-developed, well-nourished, pleasant and cooperative in NAD ?Airway:  Mallampati 2 ?Lungs:  Clear throughout to auscultation.   ?Heart:  Regular rate and rhythm; no murmurs, clicks, rubs,  or gallops. ?Abdomen:  Soft, nontender and nondistended. Normal bowel sounds.   ?Neuro/Psych:  Normal mood and affect. A and O x 3 ? ? ?[name] E. Candis Schatz, MD ?[facility] ? ?

## 2021-09-06 ENCOUNTER — Other Ambulatory Visit: Payer: Self-pay

## 2021-09-06 ENCOUNTER — Telehealth: Payer: Self-pay

## 2021-09-06 DIAGNOSIS — K6289 Other specified diseases of anus and rectum: Secondary | ICD-10-CM

## 2021-09-06 NOTE — Telephone Encounter (Signed)
CT of abd/pelvis ordered. Pt should be contacted by rad scheduling to set this scan up for the pt. ?

## 2021-09-07 ENCOUNTER — Encounter: Payer: BC Managed Care – PPO | Admitting: Physical Therapy

## 2021-09-07 ENCOUNTER — Ambulatory Visit
Admission: RE | Admit: 2021-09-07 | Discharge: 2021-09-07 | Disposition: A | Discharge status: Home / Self Care | Payer: BC Managed Care – PPO | Source: Ambulatory Visit | Attending: Family Medicine | Admitting: Family Medicine

## 2021-09-07 ENCOUNTER — Telehealth: Payer: Self-pay

## 2021-09-07 DIAGNOSIS — Z0000 Encounter for general adult medical examination without abnormal findings: Secondary | ICD-10-CM

## 2021-09-07 NOTE — Telephone Encounter (Signed)
?  Follow up Call- ? ? ?  09/05/2021  ? 10:01 AM  ?Call back number  ?Post procedure Call Back phone  # [phone]  ?Permission to leave phone message Yes  ?  ? ?Patient questions: ? ?Do you have a fever, pain , or abdominal swelling? No. ?Pain Score  0 * ? ?Have you tolerated food without any problems? Yes.   ? ?Have you been able to return to your normal activities? Yes.   ? ?Do you have any questions about your discharge instructions: ?Diet   No. ?Medications  No. ?Follow up visit  No. ? ?Do you have questions or concerns about your Care? No. ? ?Actions: ?* If pain score is 4 or above: ?No action needed, pain <4. ? ? ?

## 2021-09-07 NOTE — Progress Notes (Signed)
I spoke with Amy Odonnell over the phone today and relayed the pathology results which showed villous features without high-grade dysplasia.  This is encouraging.  I would still recommend we proceed with a CT scan, as it is possible that due to the size of the lesion there may be sampling error during the biopsies.  If the CT scan does not demonstrate any suggestion of malignancy, I would recommend we proceed with endoscopic resection.  She will need a hospital endoscopy slot for this procedure.

## 2021-09-13 ENCOUNTER — Ambulatory Visit (HOSPITAL_COMMUNITY)
Admission: RE | Admit: 2021-09-13 | Discharge: 2021-09-13 | Disposition: A | Discharge status: Home / Self Care | Payer: BC Managed Care – PPO | Source: Ambulatory Visit | Attending: Gastroenterology | Admitting: Gastroenterology

## 2021-09-13 DIAGNOSIS — K6289 Other specified diseases of anus and rectum: Secondary | ICD-10-CM | POA: Diagnosis present

## 2021-09-13 MED ORDER — IOHEXOL 300 MG/ML  SOLN
100.0000 mL | Freq: Once | INTRAMUSCULAR | Status: AC | PRN
  Administered 2021-09-13: 100 mL via INTRAVENOUS

## 2021-09-13 MED ORDER — SODIUM CHLORIDE (PF) 0.9 % IJ SOLN
INTRAMUSCULAR | Status: AC
  Filled 2021-09-13: qty 50

## 2021-09-14 ENCOUNTER — Ambulatory Visit (INDEPENDENT_AMBULATORY_CARE_PROVIDER_SITE_OTHER): Payer: BC Managed Care – PPO | Admitting: Physical Therapy

## 2021-09-14 ENCOUNTER — Encounter: Payer: Self-pay | Admitting: Physical Therapy

## 2021-09-14 DIAGNOSIS — M6281 Muscle weakness (generalized): Secondary | ICD-10-CM

## 2021-09-14 DIAGNOSIS — M25571 Pain in right ankle and joints of right foot: Secondary | ICD-10-CM

## 2021-09-14 NOTE — Therapy (Signed)
?OUTPATIENT PHYSICAL THERAPY TREATMENT ? ? ?Patient Name: Amy Odonnell ?MRN: [id number] ?DOB:03/29/1971, 51 y.o., female ?Today's Date: 09/14/2021 ? ? PT End of Session - 09/14/21 0804   ? ? Visit Number 2   ? Number of Visits 12   ? Date for PT Re-Evaluation 10/04/21   ? Authorization Type BCBS   ? PT Start Time 0805   ? PT Stop Time 8315   ? PT Time Calculation (min) 41 min   ? Activity Tolerance Patient tolerated treatment well   ? Behavior During Therapy [facility] for tasks assessed/performed   ? ?  ?  ? ?  ? ? ?Past Medical History:  ?Diagnosis Date  ? Allergy   ? SEASONAL  ? Asthma   ? Migraine   ? Seasonal allergies   ? ?Past Surgical History:  ?Procedure Laterality Date  ? APPENDECTOMY    ? ?Patient Active Problem List  ? Diagnosis Date Noted  ? Palpitations 01/31/2019  ? 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 ? ?PCP: Caren Macadam, MD ? ?REFERRING PROVIDER: Caren Macadam, MD ? ?REFERRING DIAG: Instability of ankle ? ?THERAPY DIAG:  ?Muscle weakness (generalized) ? ?Pain in right ankle and joints of right foot ? ?ONSET DATE:  ? ?SUBJECTIVE:  ? ?SUBJECTIVE STATEMENT: ?Pt with no new complaints. Has not been able to do as much of HEP this week.  ? ?Eval: Pt states instability in bil ankles. She has had multiple instances of rolling of ankles since high school. Did recently have several more in the Fall this year. Wants to strengthen to avoid possible falls or injury in the future.  ?Reports mild soreness if ankle are in Inversion position. Wearing Oncloud shoes, does stand at work some days, also works from home some. Does not currently do any strengthening or ankle exercises.   ?  ?PERTINENT HISTORY: ? ? ?PAIN:  ?Are you having pain? Yes: NPRS scale: 3/10 ?Pain location: lateral ankles ?Pain description: mild/achey with inversion ?Aggravating factors: sleeping, inversion ?Relieving factors: none, position change. ? ?PRECAUTIONS: None ? ?WEIGHT BEARING RESTRICTIONS No ? ?FALLS:  ?Has patient fallen in last 6  months? No ? ?PLOF: Independent ? ?PATIENT GOALS  improve stability in bil ankles, for less "rolling" and to prevent falls in future.  ? ? ?OBJECTIVE:  ? ?DIAGNOSTIC FINDINGS: none- unknown condition of ankle ligaments.  ? ?COGNITION: ? Overall cognitive status: Within functional limits for tasks assessed   ? ?POSTURE:  ?Standing foot posture: mild/moderate pronation ? ?PALPATION: ?Mild hypomobility for DF bil;  hypermobile inv/ev bil;  ? ?LE ROM: ?  Hips: WNL, Knee: WNL,  Ankle: mild hypomobility for DF bil;  ? ?LE MMT: ?  Hips: 4/5,  Knee: 5/5,  Ankle : inv/ev 4/5  ? ? ?LOWER EXTREMITY SPECIAL TESTS:  ? ? ?GAIT: ? unremarkable ? ? ?TODAY'S TREATMENT: ? ?09/14/21: ?Therapeutic Exercise: ?Aerobic:  ?Supine: Bridging 2 x10;  S/L hip abd 2 x10 bil;  ?Seated: Sit to stand x 10 ?Standing: Heel raises x 20; Squats x 15; Step ups Fwd and Lat 6 in no UE support x 10 bil;  Lateral step ups on Air Ex x 15; March on AirEx x 20; Walk/March 10 ft x 6;  SLS 20 sec x 2 bil;  ?Stretches: Gastroc 2 way at wall, soleus, each x 2 min bil; HR on step x 10;  ?Neuromuscular Re-education: ?Manual Therapy: ? ? ?08/23/21: Eval:  ?Therapeutic Exercise: ?Aerobic: ?Supine: Bridging x 10; S/L hip abd x10 bil;  ?Seated: ?  Standing: Heel raises x 20 with ball; SLS 20 sec x 3 bil;  ?Stretches:  Gastroc 2 way at wall, soleus, each x 2 min bil;  ?Neuromuscular Re-education: ?Manual Therapy: ? ? ?PATIENT EDUCATION:  ?Education details: PT POC, Exam findings, HEP ?Person educated: Patient ?Education method: Explanation, Demonstration, Tactile cues, Verbal cues, and Handouts ?Education comprehension: verbalized understanding, returned demonstration, verbal cues required, tactile cues required, and needs further education ? ? ?HOME EXERCISE PROGRAM: ?Access Code: [email or web address] ? ? ? ?ASSESSMENT: ? ?CLINICAL IMPRESSION: ?Pt with good tolerance for activity today and was able to progress strengthening and stabilization. Most challenged with SLS. Will benefit from  progression of strength and stabilization as tolerated.  ? ? ?OBJECTIVE IMPAIRMENTS decreased activity tolerance, decreased balance, decreased coordination, decreased ROM, decreased strength, impaired flexibility, improper body mechanics, and pain.  ? ?ACTIVITY LIMITATIONS cleaning, community activity, yard work, and shopping.  ? ?PERSONAL FACTORS  none  are also affecting patient's functional outcome.  ? ? ?REHAB POTENTIAL: Good ? ?CLINICAL DECISION MAKING: Stable/uncomplicated ? ?EVALUATION COMPLEXITY: Low ? ? ?GOALS: ?Goals reviewed with patient? Yes ? ?SHORT TERM GOALS: Target date: 09/28/2021 ? ?Pt to be independent with initial HEP  ? ?Goal status: INITIAL ? ?2.  Pt to demo full heel raise with mechanics WNL bil; ?Goal status: INITIAL ? ? ?LONG TERM GOALS: Target date: 10/26/2021 ? ?Pt to be independent with final HEP ? ?Goal status: INITIAL ? ?2.  Pt to demo ability for SLS and dynamic movement with stability of bil ankles to be WNL.  ? ?Goal status: INITIAL ? ?3.  Pt to demo ability for step ups and dynamic movement on unstable surface , to be WNL, for improved stability and safety with bil ankles.  ? ?Goal status: INITIAL ? ? ? ? ?PLAN: ?PT FREQUENCY: 1-2x/week ? ?PT DURATION: 6 weeks ? ?PLANNED INTERVENTIONS: Therapeutic exercises, Therapeutic activity, Neuromuscular re-education, Balance training, Gait training, Patient/Family education, Joint manipulation, Joint mobilization, Stair training, DME instructions, Dry Needling, Electrical stimulation, Spinal manipulation, Spinal mobilization, Cryotherapy, Moist heat, Taping, Traction, Ultrasound, Ionotophoresis '4mg'$ /ml Dexamethasone, and Manual therapy ? ?PLAN FOR NEXT SESSION:  ? ?Lyndee Hensen, PT, DPT ?8:51 PM  09/14/21 ? ? ?

## 2021-09-15 ENCOUNTER — Telehealth: Payer: Self-pay

## 2021-09-15 ENCOUNTER — Other Ambulatory Visit: Payer: Self-pay

## 2021-09-15 DIAGNOSIS — C20 Malignant neoplasm of rectum: Secondary | ICD-10-CM

## 2021-09-15 DIAGNOSIS — K6289 Other specified diseases of anus and rectum: Secondary | ICD-10-CM

## 2021-09-15 NOTE — Telephone Encounter (Signed)
Noted  

## 2021-09-15 NOTE — Telephone Encounter (Signed)
-----   Message from Daryel November, MD sent at 09/15/2021  7:36 AM EDT ----- ?[name],  ?Can you please order an MRI pelvis rectal cancer protocol with contrast and a surgery consult (Dr. Nadeen Landau) for Ms. Amy Odonnell? ? ?Thanks,  ?Dr. Loletha Grayer ?----- Message ----- ?From: Ileana Roup, MD ?Sent: 09/14/2021   6:00 PM EDT ?To: Daryel November, MD ? ?[name]! Yes, she looks like a good candidate. With a critical eye, it looks like it's posterior in the mid/upper rectum on the CT. The one thing I usually try to get prior taking for a TAMIS is a pelvic MRI rectal cancer protocol for 2 reasons - helps confirm orientation and thickness - to sort out if it were something more surprising like a T2/T3 lesion... and also lets [place] look at the lymph nodes. Occasionally these come back on final path to have foci of cancer, even T1, and after surgery they can have reactive adenopathy that can't always be distinguished from malignant on MRI, leading to confusion and even over treatment. The preop MR gives [place] a nice baseline before the water is muddy. ? ?I will send my nurse a message to get her scheduled to see me. Would you be able to order the MRI to expedite? Appreciate the message!! ? ?Gerald Stabs ? ? ?----- Message ----- ?From: Daryel November, MD ?Sent: 09/14/2021   5:36 PM EDT ?To: Ileana Roup, MD ? 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  You can barely see the edge of the polyp from the anal verge. CT negative ?It is probably out of my league for an endoscopic resection.  I ran it by [place] and he thought it would be doable but probably won't have any availability until July.  Do you think this might be resectable via TAMES? ? ?Thanks,  ?Scott ? ? ? ? ? ?

## 2021-09-15 NOTE — Telephone Encounter (Signed)
Order in for MR of pelvis for rectal cancer staging. Rad scheduling will contact pt to schedule. Referral faxed to [facility] for appt with Dr. Dema Severin. ?

## 2021-09-15 NOTE — Telephone Encounter (Signed)
Patient called to inform that she has already been called by [facility] and she has an appointment with them on 5/15.  She does not need a call back unless there is something else you need from her. ? ?Thank you. ?

## 2021-09-16 NOTE — Telephone Encounter (Signed)
Pt called and states that she got a call to schedule an appt with [facility] and she did that but she was not aware that she was going to be getting this call. She states that she had a CT scan and it was good and there was a possibility that she might need another CT after polyp/mass was removed. She got a call from rad scheduling to schedule the MRI and does not know why. She states these exams are really expensive and if she really doesn't need it she would like to avoid the scan. Let her know I would send the message to Dr. Candis Schatz. She states she is calling pts and if Dr. Candis Schatz wants to communicate with her via mychart that is fine. Please advise. ?

## 2021-09-27 ENCOUNTER — Ambulatory Visit (HOSPITAL_COMMUNITY): Admission: RE | Admit: 2021-09-27 | Payer: BC Managed Care – PPO | Source: Ambulatory Visit

## 2021-09-27 ENCOUNTER — Encounter: Payer: BC Managed Care – PPO | Admitting: Physical Therapy

## 2021-09-27 ENCOUNTER — Ambulatory Visit
Admission: RE | Admit: 2021-09-27 | Discharge: 2021-09-27 | Disposition: A | Discharge status: Home / Self Care | Payer: BC Managed Care – PPO | Source: Ambulatory Visit | Attending: Family Medicine | Admitting: Family Medicine

## 2021-09-30 ENCOUNTER — Ambulatory Visit
Admission: RE | Admit: 2021-09-30 | Discharge: 2021-09-30 | Disposition: A | Discharge status: Home / Self Care | Payer: BC Managed Care – PPO | Source: Ambulatory Visit | Attending: Gastroenterology | Admitting: Gastroenterology

## 2021-09-30 DIAGNOSIS — C20 Malignant neoplasm of rectum: Secondary | ICD-10-CM | POA: Diagnosis present

## 2021-09-30 DIAGNOSIS — K6289 Other specified diseases of anus and rectum: Secondary | ICD-10-CM | POA: Insufficient documentation

## 2021-10-04 ENCOUNTER — Encounter: Payer: Self-pay | Admitting: Gastroenterology

## 2021-10-04 NOTE — Progress Notes (Signed)
Ms. Westergard, ?Your MRI helped characterize and localize the tumor and did not show any new concerning features.  Do you have an appointment scheduled with Dr. Dema Severin (colorectal surgery)?

## 2021-10-05 ENCOUNTER — Ambulatory Visit (INDEPENDENT_AMBULATORY_CARE_PROVIDER_SITE_OTHER): Payer: BC Managed Care – PPO | Admitting: Physical Therapy

## 2021-10-05 ENCOUNTER — Encounter: Payer: Self-pay | Admitting: Physical Therapy

## 2021-10-05 DIAGNOSIS — M25571 Pain in right ankle and joints of right foot: Secondary | ICD-10-CM | POA: Diagnosis not present

## 2021-10-05 DIAGNOSIS — M6281 Muscle weakness (generalized): Secondary | ICD-10-CM

## 2021-10-05 NOTE — Therapy (Signed)
?OUTPATIENT PHYSICAL THERAPY TREATMENT/Re-Cert ? ? ?Patient Name: Amy Odonnell ?MRN: [id number] ?DOB:[birth year], 51 y.o., female ?Today's Date: 10/05/2021 ? ? PT End of Session - 10/05/21 6720   ? ? Visit Number 3   ? Number of Visits 12   ? Date for PT Re-Evaluation 11/16/21   ? Authorization Type BCBS  Re-Cert done at visit 3   ? PT Start Time 0845   ? PT Stop Time 0925   ? PT Time Calculation (min) 40 min   ? Activity Tolerance Patient tolerated treatment well   ? Behavior During Therapy [facility] for tasks assessed/performed   ? ?  ?  ? ?  ? ? ? ?Past Medical History:  ?Diagnosis Date  ? Allergy   ? SEASONAL  ? Asthma   ? Migraine   ? Seasonal allergies   ? ?Past Surgical History:  ?Procedure Laterality Date  ? APPENDECTOMY    ? ?Patient Active Problem List  ? Diagnosis Date Noted  ? Palpitations 01/31/2019  ? 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 ? ?PCP: Caren Macadam, MD ? ?REFERRING PROVIDER: Caren Macadam, MD ? ?REFERRING DIAG: Instability of ankle ? ?THERAPY DIAG:  ?Muscle weakness (generalized) ? ?Pain in right ankle and joints of right foot ? ?ONSET DATE:  ? ?SUBJECTIVE:  ? ?SUBJECTIVE STATEMENT: ?Pt has rolled each ankle since last being seen. No increased pain .  ? ?Eval: Pt states instability in bil ankles. She has had multiple instances of rolling of ankles since high school. Did recently have several more in the Fall this year. Wants to strengthen to avoid possible falls or injury in the future.  ?Reports mild soreness if ankle are in Inversion position. Wearing Oncloud shoes, does stand at work some days, also works from home some. Does not currently do any strengthening or ankle exercises.   ?  ?PERTINENT HISTORY: ? ? ?PAIN:  ?Are you having pain? Yes: NPRS scale: 0-3/10 ?Pain location: lateral ankles ?Pain description: mild/achey with inversion ?Aggravating factors: sleeping, inversion ?Relieving factors: none, position change. ? ?PRECAUTIONS: None ? ?WEIGHT BEARING RESTRICTIONS No ? ?FALLS:   ?Has patient fallen in last 6 months? No ? ?PLOF: Independent ? ?PATIENT GOALS  improve stability in bil ankles, for less "rolling" and to prevent falls in future.  ? ? ?OBJECTIVE:  ? ?DIAGNOSTIC FINDINGS: none- unknown condition of ankle ligaments.  ? ?COGNITION: ? Overall cognitive status: Within functional limits for tasks assessed   ? ?POSTURE:  ?Standing foot posture: mild/moderate pronation ? ?PALPATION: ?Mild hypomobility for DF bil;  hypermobile inv/ev bil;  ? ?LE ROM: ?  Hips: WNL, Knee: WNL,  Ankle: mild hypomobility for DF bil;  ? ?LE MMT: ?  Hips: 4/5,  Knee: 5/5,  Ankle : inv/ev 4/5  ? ? ?LOWER EXTREMITY SPECIAL TESTS:  ? ? ?GAIT: ? unremarkable ? ? ?TODAY'S TREATMENT: ? ?10/05/2021 ?Therapeutic Exercise: ?Aerobic:  ?Supine: Bridging 2 x10;   ?Seated:  ?Standing: Heel raises x 20; Squats x 20;  Lateral step ups on Air Ex x 15; onBOSU x 20;  Walk/March 10 ft x 4 ea with 7lb unilateral hold;  SLS 20 sec x 4 bil; Tandem stance with head turns x 10 bil;  BOSU: round side- squats x 15, lunge x15 bil;  lateral step overs x 20; Flat side- weight shifts L/R and A/P x 20 ea;  ?Stretches:   ?Neuromuscular Re-education: ?Manual Therapy: ? ? ? ?09/14/21: ?Therapeutic Exercise: ?Aerobic:  ?Supine: Bridging 2 x10;  S/L hip abd  2 x10 bil;  ?Seated: Sit to stand x 10 ?Standing: Heel raises x 20; Squats x 15; Step ups Fwd and Lat 6 in no UE support x 10 bil;  Lateral step ups on Air Ex x 15; March on AirEx x 20; Walk/March 10 ft x 6;  SLS 20 sec x 2 bil;  ?Stretches: Gastroc 2 way at wall, soleus, each x 2 min bil; HR on step x 10;  ?Neuromuscular Re-education: ?Manual Therapy: ? ? ? ?PATIENT EDUCATION:  ?Education details: updated and reviewed HEP ?Person educated: Patient ?Education method: Explanation, Demonstration, Tactile cues, Verbal cues, and Handouts ?Education comprehension: verbalized understanding, returned demonstration, verbal cues required, tactile cues required, and needs further education ? ? ?HOME  EXERCISE PROGRAM: ?Access Code: [email or web address] ? ? ? ?ASSESSMENT: ? ?CLINICAL IMPRESSION: ?Re-Cert:  Pt doing well with progression of activity. She is challenged with activities and has difficulty with unstable surfaces, but does not have significant sway of ankles with these planned movements. She has rolled each ankle since last visit, on outdoor surfaces. She continues to have instability that is causing ankles to give way. Pt has only been seen for 3 visits thus far, and will benefit from continued care, for improving stability.  ? ? ?OBJECTIVE IMPAIRMENTS decreased activity tolerance, decreased balance, decreased coordination, decreased ROM, decreased strength, impaired flexibility, improper body mechanics, and pain.  ? ?ACTIVITY LIMITATIONS cleaning, community activity, yard work, and shopping.  ? ?PERSONAL FACTORS  none  are also affecting patient's functional outcome.  ? ? ?REHAB POTENTIAL: Good ? ?CLINICAL DECISION MAKING: Stable/uncomplicated ? ?EVALUATION COMPLEXITY: Low ? ? ?GOALS: ?Goals reviewed with patient? Yes ? ?SHORT TERM GOALS: Target date: 10/19/2021 ? ?Pt to be independent with initial HEP  ? ?Goal status: MET ? ?2.  Pt to demo full heel raise with mechanics WNL bil; ?Goal status: MET ? ? ?LONG TERM GOALS: Target date: 11/16/2021 ? ?Pt to be independent with final HEP ? ?Goal status: IN PROGRESS ? ?2.  Pt to demo ability for SLS and dynamic movement with stability of bil ankles to be WNL.  ? ?Goal status: IN PROGRESS ? ?3.  Pt to demo ability for step ups and dynamic movement on unstable surface , to be WNL, for improved stability and safety with bil ankles.  ? ?Goal status: IN PROGRESS ? ? ? ? ?PLAN: ?PT FREQUENCY: 1-2x/week ? ?PT DURATION: 6 weeks ? ?PLANNED INTERVENTIONS: Therapeutic exercises, Therapeutic activity, Neuromuscular re-education, Balance training, Gait training, Patient/Family education, Joint manipulation, Joint mobilization, Stair training, DME instructions, Dry Needling, Electrical  stimulation, Spinal manipulation, Spinal mobilization, Cryotherapy, Moist heat, Taping, Traction, Ultrasound, Ionotophoresis 36m/ml Dexamethasone, and Manual therapy ? ?PLAN FOR NEXT SESSION:  ?Progress ankle stability, unstable surface and SL activity  ? ?LLyndee Hensen PT, DPT ?10:37 AM  10/05/21 ? ? ?

## 2021-10-24 ENCOUNTER — Ambulatory Visit: Payer: Self-pay | Admitting: Surgery

## 2021-10-24 ENCOUNTER — Encounter: Payer: Self-pay | Admitting: Surgery

## 2021-10-24 DIAGNOSIS — R739 Hyperglycemia, unspecified: Secondary | ICD-10-CM

## 2021-10-24 DIAGNOSIS — K6289 Other specified diseases of anus and rectum: Principal | ICD-10-CM | POA: Diagnosis present

## 2021-12-01 ENCOUNTER — Ambulatory Visit: Payer: BC Managed Care – PPO

## 2021-12-21 NOTE — Progress Notes (Signed)
Anesthesia Review:  PCP: Cardiologist : Chest x-ray : Monitor- 2020  EKG : Echo : 2020  Stress test: Cardiac Cath :  Activity level:  Sleep Study/ CPAP : Fasting Blood Sugar :      / Checks Blood Sugar -- times a day:   Blood Thinner/ Instructions /Last Dose: ASA / Instructions/ Last Dose :

## 2021-12-21 NOTE — Patient Instructions (Signed)
SURGICAL WAITING ROOM VISITATION Patients having surgery or a procedure may have no more than 2 support people in the waiting area - these visitors may rotate.   Children under the age of 77 must have an adult with them who is not the patient. If the patient needs to stay at the hospital during part of their recovery, the visitor guidelines for inpatient rooms apply. Pre-op nurse will coordinate an appropriate time for 1 support person to accompany patient in pre-op.  This support person may not rotate.    Please refer to the [facility] website for the visitor guidelines for Inpatients (after your surgery is over and you are in a regular room).       Your procedure is scheduled on:  01/04/2022    Report to [facility] Main Entrance    Report to admitting at  [place] AM   Call this number if you have problems the morning of surgery [phone]    Do not eat food :After Midnight.        Clear liquid diet on day of bowel prep.     After Midnight you may have the following liquids until __ 0530____ AM  DAY OF SURGERY  Water Non-Citrus Juices (without pulp, NO RED) Carbonated Beverages Black Coffee (NO MILK/CREAM OR CREAMERS, sugar ok)  Clear Tea (NO MILK/CREAM OR CREAMERS, sugar ok) regular and decaf                             Plain Jell-O (NO RED)                                           Fruit ices (not with fruit pulp, NO RED)                                     Popsicles (NO RED)                                                               Sports drinks like Gatorade (NO RED)              Drink 2 Ensure/G2 drinks AT 10:00 PM the night before surgery.        The day of surgery:  Drink ONE (1) Pre-Surgery Clear Ensure or G2 at  0530AM ( have completed by )  the morning of surgery. Drink in one sitting. Do not sip.  This drink was given to you during your hospital  pre-op appointment visit. Nothing else to drink after completing the  Pre-Surgery Clear Ensure or G2.           If you have questions, please contact your surgeon's office.   FOLLOW BOWEL PREP AND ANY ADDITIONAL PRE OP INSTRUCTIONS YOU RECEIVED FROM YOUR SURGEON'S OFFICE!!!     Oral Hygiene is also important to reduce your risk of infection.  Remember - BRUSH YOUR TEETH THE MORNING OF SURGERY WITH YOUR REGULAR TOOTHPASTE   Do NOT smoke after Midnight   Take these medicines the morning of surgery with A SIP OF WATER:  none   DO NOT TAKE ANY ORAL DIABETIC MEDICATIONS DAY OF YOUR SURGERY  Bring CPAP mask and tubing day of surgery.                              You may not have any metal on your body including hair pins, jewelry, and body piercing             Do not wear make-up, lotions, powders, perfumes/cologne, or deodorant  Do not wear nail polish including gel and S&S, artificial/acrylic nails, or any other type of covering on natural nails including finger and toenails. If you have artificial nails, gel coating, etc. that needs to be removed by a nail salon please have this removed prior to surgery or surgery may need to be canceled/ delayed if the surgeon/ anesthesia feels like they are unable to be safely monitored.   Do not shave  48 hours prior to surgery.               Men may shave face and neck.   Do not bring valuables to the hospital. [place].   Contacts, dentures or bridgework may not be worn into surgery.   Bring small overnight bag day of surgery.   DO NOT [place]. PHARMACY WILL DISPENSE MEDICATIONS LISTED ON YOUR MEDICATION LIST TO YOU DURING YOUR ADMISSION [place]!    Patients discharged on the day of surgery will not be allowed to drive home.  Someone NEEDS to stay with you for the first 24 hours after anesthesia.   Special Instructions: Bring a copy of your healthcare power of attorney and living will documents         the day of  surgery if you haven't scanned them before.              Please read over the following fact sheets you were given: IF YOU HAVE QUESTIONS ABOUT YOUR PRE-OP INSTRUCTIONS PLEASE CALL [phone]     [facility] - Preparing for Surgery Before surgery, you can play an important role.  Because skin is not sterile, your skin needs to be as free of germs as possible.  You can reduce the number of germs on your skin by washing with CHG (chlorahexidine gluconate) soap before surgery.  CHG is an antiseptic cleaner which kills germs and bonds with the skin to continue killing germs even after washing. Please DO NOT use if you have an allergy to CHG or antibacterial soaps.  If your skin becomes reddened/irritated stop using the CHG and inform your nurse when you arrive at Short Stay. Do not shave (including legs and underarms) for at least 48 hours prior to the first CHG shower.  You may shave your face/neck. Please follow these instructions carefully:  1.  Shower with CHG Soap the night before surgery and the  morning of Surgery.  2.  If you choose to wash your hair, wash your hair first as usual with your  normal  shampoo.  3.  After you shampoo, rinse your hair and body thoroughly to remove the  shampoo.  4.  Use CHG as you would any other liquid soap.  You can apply chg directly  to the skin and wash                       Gently with a scrungie or clean washcloth.  5.  Apply the CHG Soap to your body ONLY FROM THE NECK DOWN.   Do not use on face/ open                           Wound or open sores. Avoid contact with eyes, ears mouth and genitals (private parts).                       Wash face,  Genitals (private parts) with your normal soap.             6.  Wash thoroughly, paying special attention to the area where your surgery  will be performed.  7.  Thoroughly rinse your body with warm water from the neck down.  8.  DO NOT shower/wash with your normal soap after using and  rinsing off  the CHG Soap.                9.  Pat yourself dry with a clean towel.            10.  Wear clean pajamas.            11.  Place clean sheets on your bed the night of your first shower and do not  sleep with pets. Day of Surgery : Do not apply any lotions/deodorants the morning of surgery.  Please wear clean clothes to the hospital/surgery center.  FAILURE TO FOLLOW THESE INSTRUCTIONS MAY RESULT IN THE CANCELLATION OF YOUR SURGERY PATIENT SIGNATURE_________________________________  NURSE SIGNATURE__________________________________  ________________________________________________________________________

## 2021-12-26 ENCOUNTER — Other Ambulatory Visit: Payer: Self-pay

## 2021-12-26 ENCOUNTER — Encounter (HOSPITAL_COMMUNITY)
Admission: RE | Admit: 2021-12-26 | Discharge: 2021-12-26 | Disposition: A | Discharge status: Home / Self Care | Payer: BC Managed Care – PPO | Source: Ambulatory Visit | Attending: Surgery | Admitting: Surgery

## 2021-12-26 ENCOUNTER — Encounter (HOSPITAL_COMMUNITY): Payer: Self-pay

## 2021-12-26 VITALS — BP 128/82 | HR 66 | Temp 98.7°F | Resp 16 | Ht 67.0 in | Wt 192.0 lb

## 2021-12-26 DIAGNOSIS — Z01812 Encounter for preprocedural laboratory examination: Secondary | ICD-10-CM | POA: Diagnosis present

## 2021-12-26 DIAGNOSIS — R739 Hyperglycemia, unspecified: Secondary | ICD-10-CM | POA: Insufficient documentation

## 2021-12-26 DIAGNOSIS — Z01818 Encounter for other preprocedural examination: Secondary | ICD-10-CM

## 2021-12-26 HISTORY — DX: Pneumonia, unspecified organism: J18.9

## 2021-12-26 LAB — CBC
HCT: 40.8 % (ref 36.0–46.0)
Hemoglobin: 13.3 g/dL (ref 12.0–15.0)
MCH: 29.7 pg (ref 26.0–34.0)
MCHC: 32.6 g/dL (ref 30.0–36.0)
MCV: 91.1 fL (ref 80.0–100.0)
Platelets: 392 10*3/uL (ref 150–400)
RBC: 4.48 MIL/uL (ref 3.87–5.11)
RDW: 13 % (ref 11.5–15.5)
WBC: 10.4 10*3/uL (ref 4.0–10.5)
nRBC: 0 % (ref 0.0–0.2)

## 2021-12-26 LAB — HEMOGLOBIN A1C
Hgb A1c MFr Bld: 5 % (ref 4.8–5.6)
Mean Plasma Glucose: 96.8 mg/dL

## 2021-12-26 NOTE — Progress Notes (Addendum)
Anesthesia Review:  HAF:BXUXYBF at [place] Cardiologist :no Chest x-ray : Monitor- 2020  EKG : Echo : 2020  Stress test: Cardiac Cath :  Activity level: Can walk a flight of stairs without SO  Sleep Study/ CPAP : Fasting Blood Sugar :      / Checks Blood Sugar -- times a day:   Blood Thinner/ Instructions /Last Dose: ASA / Instructions/ Last Dose :

## 2022-01-03 NOTE — Anesthesia Preprocedure Evaluation (Signed)
Anesthesia Evaluation  Patient identified by MRN, date of birth, ID band Patient awake    Reviewed: Allergy & Precautions, H&P , NPO status , Patient's Chart, lab work & pertinent test results  Airway Mallampati: II  TM Distance: >3 FB Neck ROM: Full    Dental no notable dental hx. (+) Teeth Intact, Dental Advisory Given   Pulmonary asthma ,    Pulmonary exam normal breath sounds clear to auscultation       Cardiovascular Exercise Tolerance: Good negative cardio ROS   Rhythm:Regular Rate:Normal     Neuro/Psych  Headaches, negative psych ROS   GI/Hepatic negative GI ROS, Neg liver ROS,   Endo/Other  negative endocrine ROS  Renal/GU negative Renal ROS  negative genitourinary   Musculoskeletal   Abdominal   Peds  Hematology negative hematology ROS (+)   Anesthesia Other Findings   Reproductive/Obstetrics negative OB ROS                            Anesthesia Physical Anesthesia Plan  ASA: 2  Anesthesia Plan: General   Post-op Pain Management: Tylenol PO (pre-op)* and Celebrex PO (pre-op)*   Induction: Intravenous  PONV Risk Score and Plan: 4 or greater and Ondansetron, Dexamethasone, Midazolam, Scopolamine patch - Pre-op and Propofol infusion  Airway Management Planned: Oral ETT  Additional Equipment:   Intra-op Plan:   Post-operative Plan: Extubation in OR  Informed Consent: I have reviewed the patients History and Physical, chart, labs and discussed the procedure including the risks, benefits and alternatives for the proposed anesthesia with the patient or authorized representative who has indicated his/her understanding and acceptance.     Dental advisory given  Plan Discussed with: CRNA  Anesthesia Plan Comments:        Anesthesia Quick Evaluation

## 2022-01-04 ENCOUNTER — Inpatient Hospital Stay (HOSPITAL_COMMUNITY): Payer: BC Managed Care – PPO | Admitting: Certified Registered"

## 2022-01-04 ENCOUNTER — Other Ambulatory Visit: Payer: Self-pay

## 2022-01-04 ENCOUNTER — Encounter (HOSPITAL_COMMUNITY)
Admission: RE | Disposition: A | Discharge status: Home / Self Care | Payer: Self-pay | Source: Home / Self Care | Attending: Surgery

## 2022-01-04 ENCOUNTER — Encounter (HOSPITAL_COMMUNITY): Payer: Self-pay | Admitting: Surgery

## 2022-01-04 ENCOUNTER — Inpatient Hospital Stay (HOSPITAL_COMMUNITY)
Admission: RE | Admit: 2022-01-04 | Discharge: 2022-01-05 | DRG: 334 | Disposition: A | Discharge status: Home / Self Care | Payer: BC Managed Care – PPO | Attending: Surgery | Admitting: Surgery

## 2022-01-04 DIAGNOSIS — Z01818 Encounter for other preprocedural examination: Secondary | ICD-10-CM

## 2022-01-04 DIAGNOSIS — K6289 Other specified diseases of anus and rectum: Principal | ICD-10-CM | POA: Diagnosis present

## 2022-01-04 DIAGNOSIS — Z8249 Family history of ischemic heart disease and other diseases of the circulatory system: Secondary | ICD-10-CM

## 2022-01-04 DIAGNOSIS — Z83438 Family history of other disorder of lipoprotein metabolism and other lipidemia: Secondary | ICD-10-CM

## 2022-01-04 DIAGNOSIS — G43909 Migraine, unspecified, not intractable, without status migrainosus: Secondary | ICD-10-CM | POA: Insufficient documentation

## 2022-01-04 DIAGNOSIS — Z833 Family history of diabetes mellitus: Secondary | ICD-10-CM

## 2022-01-04 DIAGNOSIS — Z885 Allergy status to narcotic agent status: Secondary | ICD-10-CM

## 2022-01-04 DIAGNOSIS — J302 Other seasonal allergic rhinitis: Secondary | ICD-10-CM | POA: Insufficient documentation

## 2022-01-04 DIAGNOSIS — D128 Benign neoplasm of rectum: Principal | ICD-10-CM | POA: Diagnosis present

## 2022-01-04 DIAGNOSIS — J45909 Unspecified asthma, uncomplicated: Secondary | ICD-10-CM | POA: Diagnosis present

## 2022-01-04 HISTORY — PX: LAPAROSCOPIC PARTIAL PROTECTOMY: SHX5912

## 2022-01-04 LAB — TYPE AND SCREEN
ABO/RH(D): A POS
Antibody Screen: NEGATIVE

## 2022-01-04 LAB — ABO/RH: ABO/RH(D): A POS

## 2022-01-04 SURGERY — PROCTECTOMY, PARTIAL, LAPAROSCOPIC
Anesthesia: General

## 2022-01-04 MED ORDER — LACTATED RINGERS IV SOLN: INTRAVENOUS | Status: DC | PRN

## 2022-01-04 MED ORDER — FENTANYL CITRATE (PF) 100 MCG/2ML IJ SOLN
INTRAMUSCULAR | Status: AC
  Filled 2022-01-04: qty 2

## 2022-01-04 MED ORDER — AMISULPRIDE (ANTIEMETIC) 5 MG/2ML IV SOLN
10.0000 mg | Freq: Once | INTRAVENOUS | Status: AC
Start: 2022-01-04 — End: 2022-01-04
  Administered 2022-01-04: 10 mg via INTRAVENOUS

## 2022-01-04 MED ORDER — ALBUTEROL SULFATE (2.5 MG/3ML) 0.083% IN NEBU: 2.5000 mg | INHALATION_SOLUTION | Freq: Four times a day (QID) | RESPIRATORY_TRACT | Status: DC | PRN

## 2022-01-04 MED ORDER — FENTANYL CITRATE (PF) 100 MCG/2ML IJ SOLN
INTRAMUSCULAR | Status: DC | PRN
  Administered 2022-01-04 (×2): 25 ug via INTRAVENOUS
  Administered 2022-01-04: 50 ug via INTRAVENOUS

## 2022-01-04 MED ORDER — ALBUTEROL SULFATE HFA 108 (90 BASE) MCG/ACT IN AERS: 2.0000 | INHALATION_SPRAY | Freq: Four times a day (QID) | RESPIRATORY_TRACT | Status: DC | PRN

## 2022-01-04 MED ORDER — ENSURE PRE-SURGERY PO LIQD
592.0000 mL | Freq: Once | ORAL | Status: DC
  Filled 2022-01-04: qty 592

## 2022-01-04 MED ORDER — ENSURE SURGERY PO LIQD
237.0000 mL | Freq: Two times a day (BID) | ORAL | Status: DC
  Administered 2022-01-05: 237 mL via ORAL

## 2022-01-04 MED ORDER — SODIUM CHLORIDE 0.9 % IV SOLN
INTRAVENOUS | Status: AC
  Filled 2022-01-04: qty 2

## 2022-01-04 MED ORDER — ACETAMINOPHEN 500 MG PO TABS: 1000.0000 mg | ORAL_TABLET | Freq: Once | ORAL | Status: DC

## 2022-01-04 MED ORDER — PHENYLEPHRINE HCL-NACL 20-0.9 MG/250ML-% IV SOLN
INTRAVENOUS | Status: AC
  Filled 2022-01-04: qty 250

## 2022-01-04 MED ORDER — SIMETHICONE 80 MG PO CHEW: 40.0000 mg | CHEWABLE_TABLET | Freq: Four times a day (QID) | ORAL | Status: DC | PRN

## 2022-01-04 MED ORDER — ACETAMINOPHEN 500 MG PO TABS
1000.0000 mg | ORAL_TABLET | Freq: Four times a day (QID) | ORAL | Status: DC
  Administered 2022-01-04 – 2022-01-05 (×4): 1000 mg via ORAL
  Filled 2022-01-04 (×4): qty 2

## 2022-01-04 MED ORDER — OXYCODONE HCL 5 MG PO TABS
5.0000 mg | ORAL_TABLET | ORAL | Status: DC | PRN
  Administered 2022-01-04: 5 mg via ORAL
  Filled 2022-01-04: qty 2

## 2022-01-04 MED ORDER — DEXAMETHASONE SODIUM PHOSPHATE 10 MG/ML IJ SOLN
INTRAMUSCULAR | Status: AC
  Filled 2022-01-04: qty 1

## 2022-01-04 MED ORDER — ENSURE PRE-SURGERY PO LIQD
296.0000 mL | Freq: Once | ORAL | Status: DC
  Filled 2022-01-04: qty 296

## 2022-01-04 MED ORDER — PROPOFOL 500 MG/50ML IV EMUL
INTRAVENOUS | Status: AC
Start: 2022-01-04 — End: ?
  Filled 2022-01-04: qty 50

## 2022-01-04 MED ORDER — BUPIVACAINE LIPOSOME 1.3 % IJ SUSP
INTRAMUSCULAR | Status: DC | PRN
  Administered 2022-01-04: 20 mL

## 2022-01-04 MED ORDER — BUPIVACAINE LIPOSOME 1.3 % IJ SUSP: 20.0000 mL | Freq: Once | INTRAMUSCULAR | Status: DC

## 2022-01-04 MED ORDER — METOPROLOL TARTRATE 5 MG/5ML IV SOLN: 5.0000 mg | Freq: Four times a day (QID) | INTRAVENOUS | Status: DC | PRN

## 2022-01-04 MED ORDER — PHENYLEPHRINE HCL-NACL 20-0.9 MG/250ML-% IV SOLN
INTRAVENOUS | Status: DC | PRN
  Administered 2022-01-04: 25 ug/min via INTRAVENOUS

## 2022-01-04 MED ORDER — DIAZEPAM 5 MG PO TABS: 5.0000 mg | ORAL_TABLET | Freq: Four times a day (QID) | ORAL | Status: DC | PRN

## 2022-01-04 MED ORDER — CELECOXIB 200 MG PO CAPS: 200.0000 mg | ORAL_CAPSULE | Freq: Once | ORAL | Status: DC

## 2022-01-04 MED ORDER — SODIUM CHLORIDE 0.9% FLUSH
3.0000 mL | Freq: Two times a day (BID) | INTRAVENOUS | Status: DC
  Administered 2022-01-05: 3 mL via INTRAVENOUS

## 2022-01-04 MED ORDER — BUPIVACAINE-EPINEPHRINE 0.25% -1:200000 IJ SOLN
INTRAMUSCULAR | Status: DC | PRN
  Administered 2022-01-04: 30 mL

## 2022-01-04 MED ORDER — ONDANSETRON HCL 4 MG/2ML IJ SOLN
INTRAMUSCULAR | Status: DC | PRN
  Administered 2022-01-04: 4 mg via INTRAVENOUS

## 2022-01-04 MED ORDER — MIDAZOLAM HCL 5 MG/5ML IJ SOLN
INTRAMUSCULAR | Status: DC | PRN
  Administered 2022-01-04: 2 mg via INTRAVENOUS

## 2022-01-04 MED ORDER — LIDOCAINE 2% (20 MG/ML) 5 ML SYRINGE
INTRAMUSCULAR | Status: DC | PRN
  Administered 2022-01-04: 1.5 mg/kg/h via INTRAVENOUS
  Administered 2022-01-04: 80 mg via INTRAVENOUS

## 2022-01-04 MED ORDER — CHLORHEXIDINE GLUCONATE 0.12 % MT SOLN
15.0000 mL | Freq: Once | OROMUCOSAL | Status: AC
  Administered 2022-01-04: 15 mL via OROMUCOSAL

## 2022-01-04 MED ORDER — SODIUM CHLORIDE (PF) 0.9 % IJ SOLN
INTRAMUSCULAR | Status: AC
Start: 2022-01-04 — End: ?
  Filled 2022-01-04: qty 30

## 2022-01-04 MED ORDER — ALVIMOPAN 12 MG PO CAPS
12.0000 mg | ORAL_CAPSULE | Freq: Two times a day (BID) | ORAL | Status: DC
  Administered 2022-01-05: 12 mg via ORAL
  Filled 2022-01-04: qty 1

## 2022-01-04 MED ORDER — GABAPENTIN 300 MG PO CAPS
300.0000 mg | ORAL_CAPSULE | ORAL | Status: AC
  Administered 2022-01-04: 300 mg via ORAL
  Filled 2022-01-04: qty 1

## 2022-01-04 MED ORDER — MELATONIN 3 MG PO TABS: 3.0000 mg | ORAL_TABLET | Freq: Every evening | ORAL | Status: DC | PRN

## 2022-01-04 MED ORDER — METHOCARBAMOL 500 MG PO TABS: 1000.0000 mg | ORAL_TABLET | Freq: Four times a day (QID) | ORAL | Status: DC | PRN

## 2022-01-04 MED ORDER — ALVIMOPAN 12 MG PO CAPS
12.0000 mg | ORAL_CAPSULE | ORAL | Status: AC
  Administered 2022-01-04: 12 mg via ORAL
  Filled 2022-01-04: qty 1

## 2022-01-04 MED ORDER — ROCURONIUM BROMIDE 10 MG/ML (PF) SYRINGE
PREFILLED_SYRINGE | INTRAVENOUS | Status: DC | PRN
  Administered 2022-01-04 (×2): 10 mg via INTRAVENOUS
  Administered 2022-01-04: 80 mg via INTRAVENOUS
  Administered 2022-01-04: 10 mg via INTRAVENOUS

## 2022-01-04 MED ORDER — 0.9 % SODIUM CHLORIDE (POUR BTL) OPTIME
TOPICAL | Status: DC | PRN
  Administered 2022-01-04: 1000 mL

## 2022-01-04 MED ORDER — HYDROMORPHONE HCL 1 MG/ML IJ SOLN
0.2500 mg | INTRAMUSCULAR | Status: DC | PRN
  Administered 2022-01-04: 0.25 mg via INTRAVENOUS
  Administered 2022-01-04 (×2): 0.5 mg via INTRAVENOUS

## 2022-01-04 MED ORDER — LACTATED RINGERS IV SOLN: INTRAVENOUS | Status: DC

## 2022-01-04 MED ORDER — SODIUM CHLORIDE 0.9% FLUSH: 3.0000 mL | INTRAVENOUS | Status: DC | PRN

## 2022-01-04 MED ORDER — ORAL CARE MOUTH RINSE: 15.0000 mL | Freq: Once | OROMUCOSAL | Status: AC

## 2022-01-04 MED ORDER — LACTATED RINGERS IV BOLUS: 1000.0000 mL | Freq: Three times a day (TID) | INTRAVENOUS | Status: DC | PRN

## 2022-01-04 MED ORDER — ENOXAPARIN SODIUM 40 MG/0.4ML IJ SOSY
40.0000 mg | PREFILLED_SYRINGE | Freq: Once | INTRAMUSCULAR | Status: AC
  Administered 2022-01-04: 40 mg via SUBCUTANEOUS
  Filled 2022-01-04: qty 0.4

## 2022-01-04 MED ORDER — HYDROMORPHONE HCL 1 MG/ML IJ SOLN
INTRAMUSCULAR | Status: AC
  Filled 2022-01-04: qty 1

## 2022-01-04 MED ORDER — SODIUM CHLORIDE 0.9 % IV SOLN
2.0000 g | Freq: Two times a day (BID) | INTRAVENOUS | Status: AC
  Administered 2022-01-04: 2 g via INTRAVENOUS
  Filled 2022-01-04: qty 2

## 2022-01-04 MED ORDER — MIDAZOLAM HCL 2 MG/2ML IJ SOLN
INTRAMUSCULAR | Status: AC
  Filled 2022-01-04: qty 2

## 2022-01-04 MED ORDER — DEXAMETHASONE SODIUM PHOSPHATE 10 MG/ML IJ SOLN
INTRAMUSCULAR | Status: DC | PRN
  Administered 2022-01-04: 10 mg via INTRAVENOUS

## 2022-01-04 MED ORDER — ROCURONIUM BROMIDE 10 MG/ML (PF) SYRINGE
PREFILLED_SYRINGE | INTRAVENOUS | Status: AC
  Filled 2022-01-04: qty 10

## 2022-01-04 MED ORDER — SUGAMMADEX SODIUM 200 MG/2ML IV SOLN
INTRAVENOUS | Status: DC | PRN
  Administered 2022-01-04: 200 mg via INTRAVENOUS

## 2022-01-04 MED ORDER — LACTATED RINGERS IV SOLN: INTRAVENOUS | Status: AC

## 2022-01-04 MED ORDER — MAGIC MOUTHWASH: 15.0000 mL | Freq: Four times a day (QID) | ORAL | Status: DC | PRN

## 2022-01-04 MED ORDER — LIDOCAINE 2% (20 MG/ML) 5 ML SYRINGE
INTRAMUSCULAR | Status: AC
  Filled 2022-01-04: qty 5

## 2022-01-04 MED ORDER — ACETAMINOPHEN 500 MG PO TABS
1000.0000 mg | ORAL_TABLET | ORAL | Status: AC
  Administered 2022-01-04: 1000 mg via ORAL
  Filled 2022-01-04: qty 2

## 2022-01-04 MED ORDER — BISACODYL 5 MG PO TBEC: 20.0000 mg | DELAYED_RELEASE_TABLET | Freq: Once | ORAL | Status: DC

## 2022-01-04 MED ORDER — DIPHENHYDRAMINE HCL 12.5 MG/5ML PO ELIX: 12.5000 mg | ORAL_SOLUTION | Freq: Four times a day (QID) | ORAL | Status: DC | PRN

## 2022-01-04 MED ORDER — ONDANSETRON HCL 4 MG PO TABS: 4.0000 mg | ORAL_TABLET | Freq: Four times a day (QID) | ORAL | Status: DC | PRN

## 2022-01-04 MED ORDER — AMISULPRIDE (ANTIEMETIC) 5 MG/2ML IV SOLN
INTRAVENOUS | Status: AC
  Filled 2022-01-04: qty 4

## 2022-01-04 MED ORDER — WITCH HAZEL-GLYCERIN EX PADS: MEDICATED_PAD | CUTANEOUS | Status: DC | PRN

## 2022-01-04 MED ORDER — HYDROCORT-PRAMOXINE (PERIANAL) 2.5-1 % EX CREA: TOPICAL_CREAM | Freq: Four times a day (QID) | CUTANEOUS | Status: DC | PRN

## 2022-01-04 MED ORDER — PROPOFOL 10 MG/ML IV BOLUS
INTRAVENOUS | Status: AC
Start: 2022-01-04 — End: ?
  Filled 2022-01-04: qty 20

## 2022-01-04 MED ORDER — OXYCODONE HCL 5 MG PO TABS: 5.0000 mg | ORAL_TABLET | Freq: Four times a day (QID) | ORAL | 0 refills | Status: DC | PRN

## 2022-01-04 MED ORDER — SODIUM CHLORIDE 0.9 % IV SOLN: 250.0000 mL | INTRAVENOUS | Status: DC | PRN

## 2022-01-04 MED ORDER — HYDRALAZINE HCL 20 MG/ML IJ SOLN: 10.0000 mg | INTRAMUSCULAR | Status: DC | PRN

## 2022-01-04 MED ORDER — ONDANSETRON HCL 4 MG/2ML IJ SOLN: 4.0000 mg | Freq: Four times a day (QID) | INTRAMUSCULAR | Status: DC | PRN

## 2022-01-04 MED ORDER — SCOPOLAMINE 1 MG/3DAYS TD PT72
MEDICATED_PATCH | TRANSDERMAL | Status: DC | PRN
  Administered 2022-01-04: 1 via TRANSDERMAL

## 2022-01-04 MED ORDER — CHLORHEXIDINE GLUCONATE CLOTH 2 % EX PADS: 6.0000 | MEDICATED_PAD | Freq: Once | CUTANEOUS | Status: DC

## 2022-01-04 MED ORDER — FLUTICASONE PROPIONATE 50 MCG/ACT NA SUSP: 1.0000 | Freq: Every day | NASAL | Status: DC | PRN

## 2022-01-04 MED ORDER — ALUM & MAG HYDROXIDE-SIMETH 200-200-20 MG/5ML PO SUSP: 30.0000 mL | Freq: Four times a day (QID) | ORAL | Status: DC | PRN

## 2022-01-04 MED ORDER — PROCHLORPERAZINE EDISYLATE 10 MG/2ML IJ SOLN: 5.0000 mg | Freq: Four times a day (QID) | INTRAMUSCULAR | Status: DC | PRN

## 2022-01-04 MED ORDER — SODIUM CHLORIDE 0.9 % IV SOLN
2.0000 g | INTRAVENOUS | Status: AC
  Administered 2022-01-04: 2 g via INTRAVENOUS
  Filled 2022-01-04: qty 2

## 2022-01-04 MED ORDER — PROCHLORPERAZINE MALEATE 10 MG PO TABS: 10.0000 mg | ORAL_TABLET | Freq: Four times a day (QID) | ORAL | Status: DC | PRN

## 2022-01-04 MED ORDER — ENOXAPARIN SODIUM 40 MG/0.4ML IJ SOSY
40.0000 mg | PREFILLED_SYRINGE | INTRAMUSCULAR | Status: DC
  Administered 2022-01-05: 40 mg via SUBCUTANEOUS
  Filled 2022-01-04: qty 0.4

## 2022-01-04 MED ORDER — LIP MEDEX EX OINT
TOPICAL_OINTMENT | Freq: Two times a day (BID) | CUTANEOUS | Status: DC
  Administered 2022-01-04: 1 via TOPICAL
  Administered 2022-01-05: 75 via TOPICAL
  Filled 2022-01-04: qty 7

## 2022-01-04 MED ORDER — LACTATED RINGERS IR SOLN
Status: DC | PRN
  Administered 2022-01-04: 1000 mL

## 2022-01-04 MED ORDER — HYDROMORPHONE HCL 1 MG/ML IJ SOLN: 0.5000 mg | INTRAMUSCULAR | Status: DC | PRN

## 2022-01-04 MED ORDER — CELECOXIB 200 MG PO CAPS
200.0000 mg | ORAL_CAPSULE | ORAL | Status: AC
  Administered 2022-01-04: 200 mg via ORAL
  Filled 2022-01-04: qty 1

## 2022-01-04 MED ORDER — ONDANSETRON HCL 4 MG/2ML IJ SOLN
INTRAMUSCULAR | Status: AC
  Filled 2022-01-04: qty 2

## 2022-01-04 MED ORDER — LIDOCAINE HCL 2 % IJ SOLN
INTRAMUSCULAR | Status: AC
  Filled 2022-01-04: qty 20

## 2022-01-04 MED ORDER — NEOMYCIN SULFATE 500 MG PO TABS: 1000.0000 mg | ORAL_TABLET | ORAL | Status: DC

## 2022-01-04 MED ORDER — METHOCARBAMOL 1000 MG/10ML IJ SOLN: 1000.0000 mg | Freq: Four times a day (QID) | INTRAVENOUS | Status: DC | PRN

## 2022-01-04 MED ORDER — DIPHENHYDRAMINE HCL 50 MG/ML IJ SOLN: 12.5000 mg | Freq: Four times a day (QID) | INTRAMUSCULAR | Status: DC | PRN

## 2022-01-04 MED ORDER — POLYETHYLENE GLYCOL 3350 17 GM/SCOOP PO POWD: 1.0000 | Freq: Once | ORAL | Status: DC

## 2022-01-04 MED ORDER — CALCIUM POLYCARBOPHIL 625 MG PO TABS
625.0000 mg | ORAL_TABLET | Freq: Two times a day (BID) | ORAL | Status: DC
  Administered 2022-01-04 – 2022-01-05 (×2): 625 mg via ORAL
  Filled 2022-01-04 (×2): qty 1

## 2022-01-04 MED ORDER — PROPOFOL 10 MG/ML IV BOLUS
INTRAVENOUS | Status: DC | PRN
  Administered 2022-01-04: 180 mg via INTRAVENOUS
  Administered 2022-01-04: 25 ug/kg/min via INTRAVENOUS

## 2022-01-04 MED ORDER — METRONIDAZOLE 500 MG PO TABS: 1000.0000 mg | ORAL_TABLET | ORAL | Status: DC

## 2022-01-04 MED ORDER — BUPIVACAINE-EPINEPHRINE (PF) 0.5% -1:200000 IJ SOLN
INTRAMUSCULAR | Status: AC
  Filled 2022-01-04: qty 30

## 2022-01-04 MED ORDER — SCOPOLAMINE 1 MG/3DAYS TD PT72
MEDICATED_PATCH | TRANSDERMAL | Status: AC
  Filled 2022-01-04: qty 1

## 2022-01-04 MED ORDER — LORATADINE 10 MG PO TABS
10.0000 mg | ORAL_TABLET | Freq: Every day | ORAL | Status: DC
  Administered 2022-01-05: 10 mg via ORAL
  Filled 2022-01-04: qty 1

## 2022-01-04 MED ORDER — BUPIVACAINE LIPOSOME 1.3 % IJ SUSP
INTRAMUSCULAR | Status: AC
Start: 2022-01-04 — End: ?
  Filled 2022-01-04: qty 20

## 2022-01-04 SURGICAL SUPPLY — 53 items
ABDOMINAL PAD ABD ×1 IMPLANT
COVER SURGICAL LIGHT HANDLE (MISCELLANEOUS) ×2 IMPLANT
COVER TIP SHEARS 8 DVNC (MISCELLANEOUS) ×1 IMPLANT
COVER TIP SHEARS 8MM DA VINCI (MISCELLANEOUS) ×2
DRAPE ARM DVNC X/XI (DISPOSABLE) ×4 IMPLANT
DRAPE C-ARM 42X120 X-RAY (DRAPES) IMPLANT
DRAPE COLUMN DVNC XI (DISPOSABLE) ×1 IMPLANT
DRAPE DA VINCI XI ARM (DISPOSABLE) ×8
DRAPE DA VINCI XI COLUMN (DISPOSABLE) ×2
DRAPE ORTHO SPLIT 77X108 STRL (DRAPES)
DRAPE SURG IRRIG POUCH 19X23 (DRAPES) ×2 IMPLANT
DRAPE SURG ORHT 6 SPLT 77X108 (DRAPES) IMPLANT
DRSG PAD ABDOMINAL 8X10 ST (GAUZE/BANDAGES/DRESSINGS) IMPLANT
ELECT REM PT RETURN 15FT ADLT (MISCELLANEOUS) ×2 IMPLANT
GAUZE SPONGE 4X4 12PLY STRL (GAUZE/BANDAGES/DRESSINGS) ×1 IMPLANT
GLOVE ECLIPSE 8.0 STRL XLNG CF (GLOVE) ×2 IMPLANT
GLOVE INDICATOR 8.0 STRL GRN (GLOVE) ×2 IMPLANT
GOWN STRL REUS W/ TWL XL LVL3 (GOWN DISPOSABLE) ×3 IMPLANT
GOWN STRL REUS W/TWL XL LVL3 (GOWN DISPOSABLE) ×6
IRRIG SUCT STRYKERFLOW 2 WTIP (MISCELLANEOUS) ×2
IRRIGATION SUCT STRKRFLW 2 WTP (MISCELLANEOUS) ×1 IMPLANT
KIT BASIN OR (CUSTOM PROCEDURE TRAY) ×2 IMPLANT
KIT TURNOVER KIT A (KITS) ×1 IMPLANT
LEGGING LITHOTOMY PAIR STRL (DRAPES) ×1 IMPLANT
NDL INSUFFLATION 14GA 150MM (NEEDLE) IMPLANT
NEEDLE INSUFFLATION 14GA 150MM (NEEDLE) ×2 IMPLANT
PAD POSITIONING PINK XL (MISCELLANEOUS) IMPLANT
PANTS MESH DISP LRG (UNDERPADS AND DIAPERS) ×2 IMPLANT
PLATFORM TRANSANAL ACCESS 4X5 (MISCELLANEOUS) ×1 IMPLANT
PLATFORM TRANSANAL ACCESS 4X5. (MISCELLANEOUS) ×2
SCISSORS LAP 5X35 DISP (ENDOMECHANICALS) IMPLANT
SEAL CANN UNIV 5-8 DVNC XI (MISCELLANEOUS) ×3 IMPLANT
SEAL XI 5MM-8MM UNIVERSAL (MISCELLANEOUS) ×6
SEALER VESSEL DA VINCI XI (MISCELLANEOUS)
SEALER VESSEL EXT DVNC XI (MISCELLANEOUS) IMPLANT
SET TRI-LUMEN FLTR TB AIRSEAL (TUBING) ×2 IMPLANT
SOLUTION ELECTROLUBE (MISCELLANEOUS) ×2 IMPLANT
STOPCOCK 4 WAY LG BORE MALE ST (IV SETS) ×4 IMPLANT
SURGILUBE 2OZ TUBE FLIPTOP (MISCELLANEOUS) ×2 IMPLANT
SUT PROLENE 0 CT 1 30 (SUTURE) ×1 IMPLANT
SUT PROLENE 0 SH 30 (SUTURE) ×3 IMPLANT
SUT SILK 0 SH 30 (SUTURE) IMPLANT
SUT V-LOC BARB 180 2/0GR6 GS22 (SUTURE) ×6
SUT VLOC BARB 180 ABS3/0GR12 (SUTURE)
SUTURE V-LC BRB 180 2/0GR6GS22 (SUTURE) ×1 IMPLANT
SUTURE VLOC BRB 180 ABS3/0GR12 (SUTURE) IMPLANT
SYR BULB IRRIG 60ML STRL (SYRINGE) IMPLANT
TOWEL OR 17X26 10 PK STRL BLUE (TOWEL DISPOSABLE) ×2 IMPLANT
TOWEL OR NON WOVEN STRL DISP B (DISPOSABLE) ×2 IMPLANT
TRAY FOLEY MTR SLVR 16FR STAT (SET/KITS/TRAYS/PACK) ×2 IMPLANT
TRAY LAPAROSCOPIC (CUSTOM PROCEDURE TRAY) ×2 IMPLANT
TROCAR ADV FIXATION 5X100MM (TROCAR) ×1 IMPLANT
TROCAR PORT AIRSEAL 5X120 (TROCAR) ×2 IMPLANT

## 2022-01-04 NOTE — Discharge Instructions (Signed)
##############################################  ANORECTAL SURGERY:  POST OPERATIVE INSTRUCTIONS  ######################################################################  EAT Start with a pureed / full liquid diet After 24 hours, gradually transition to a high fiber diet.    CONTROL PAIN Control pain so you can tolerate bowel movements,  walk, sleep, tolerate sneezing/coughing, and go up/down stairs.   HAVE A BOWEL MOVEMENT DAILY Keep your bowels regular to avoid problems.   Taking a fiber supplement every day to keep bowels soft.   Try a laxative to override constipation. Use an antidairrheal to slow down diarrhea.   Call if not better after 2 tries  WALK Walk an hour a day.  Control your pain to do that.   CALL IF YOU HAVE PROBLEMS/CONCERNS Call if you are still struggling despite following these instructions. Call if you have concerns not answered by these instructions  ######################################################################    Take your usually prescribed home medications unless otherwise directed.  DIET: Follow a light bland diet & liquids the first 24 hours after arrival home, such as soup, liquids, starches, etc.  Be sure to drink plenty of fluids.  Quickly advance to a usual solid diet within a few days.  Avoid fast food or heavy meals as your are more likely to get nauseated or have irregular bowels.  A low-fat, high-fiber diet for the rest of your life is ideal.  PAIN CONTROL: Expect swelling and discomfort in the anus/rectal area. Pain is best controlled by a usual combination of many methods TOGETHER: Warm baths/soaks or Ice packs Over the counter pain medication Prescription pain medications Topical creams    Warm water baths or ice packs (30-60 minutes up to 8 times a day, especially after bowel meovements) will help. Use ice for the first few days to help decrease swelling and bruising, then switch to heat such as warm towels, sitz baths, warm  baths, warm showers, etc to help relax tight/sore spots and speed recovery.  Some people prefer to use ice alone, heat alone, alternating between ice & heat.  Experiment to what works for you.    It is helpful to take an over-the-counter pain medication continuously for the first few weeks.  Choose one of the following that works best for you: Naproxen (Aleve, etc)  Two 220mg tabs twice a day Ibuprofen (Advil, etc) Three 200mg tabs four times a day (every meal & bedtime) Acetaminophen (Tylenol, etc) 500-650mg four times a day (every meal & bedtime)  A  prescription for pain medication (such as oxycodone, hydrocodone, etc) should be given to you upon discharge.  Take your pain medication as prescribed.  If you are having problems/concerns with the prescription medicine (does not control pain, nausea, vomiting, rash, itching, etc), please call us [phone] to see if we need to switch you to a different pain medicine that will work better for you and/or control your side effect better. If you need a refill on your pain medication, please contact your pharmacy.  They will contact our office to request authorization. Prescriptions will not be filled after 5 pm or on week-ends.  If can take up to 48 hours for it to be filled & ready so avoid waiting until you are down to thel ast pill.  A topical cream (Dibucaine) or a prescription for a cream (such as diltiazem 2% gel) may be given to you.  Many people find relief with topical creams.  Some people find it burns too much.  Experiment.  If it helps, use it.  If it burns, don't   using it.  You also may receive a prescription for diazepam, a muscle relaxant to help you to be able to urinate and defecate more easily.  It is safe to take a few doses with the other medications as long as you are not planning to drive or do anything intense.  Hopefully this can minimize the chance of needing a Foley catheter into your bladder     KEEP YOUR BOWELS  REGULAR The goal is one soft bowel movement a day Avoid getting constipated.  Between the surgery and the pain medications, it is common to experience some constipation.  Increasing fluid intake and taking a fiber supplement (such as Metamucil, Citrucel, FiberCon, MiraLax, etc) 2-4 times a day regularly will usually help prevent this problem from occurring.  A mild laxative (prune juice, Milk of Magnesia, MiraLax, etc) should be taken according to package directions if there are no bowel movements after 48 hours. Watch out for diarrhea.  If you have many loose bowel movements, simplify your diet to bland foods & liquids for a few days.  Stop any stool softeners and decrease your fiber supplement.  Switching to mild anti-diarrheal medications (Kayopectate, Pepto Bismol) can help.  Can try an imodium/loperamide dose.  If this worsens or does not improve, please call us.  Wound Care   a. You have some fluffed gauze on top of the anus to help catch drainage and bleeding.  Let the gauze fall off with the first bowel movement or shower.  It is okay to reinforce or replace as needed.  Bleeding is common at first and occasionally tapers off   b. Place soft cotton balls on the anus/wounds and use an absorbent pad in your underwear as needed to catch any drainage and help keep the area.  Try to use cotton balls or pads over regular gauze or toilet paper as gauze will stick and pull, causing pain.  Cotton will come off more easily.   c. Keep the area clean and dry.  Bathe / shower every day.  Keep the area clean by showering / bathing over the incision / wound.   It is okay to soak an open wound to help wash it.  Consider using a squeeze bottle filled with warm water to gently wash the anal area.  Wet wipes or showers / gentle washing after bowel movements is often less traumatic than regular toilet paper.  Use a Sitz Bath 4-8 times a day for relief  A sitz bath is a warm water bath taken in the sitting position  that covers only the hips and buttocks. It may be used for either healing or hygiene purposes. Sitz baths are also used to relieve pain, itching, or muscle spasms.  Gently cleaned the area and the heat will help lower spasm and offer better pain control.    Fill the bathtub half full with warm water. Sit in the water and open the drain a little. Turn on the warm water to keep the tub half full. Keep the water running constantly. Soak in the water for 15 to 20 minutes. After the sitz bath, pat the affected area dry first.   d. You will often notice bleeding, especially with bowel movements.  This should slow down by the end of the first week of surgery.  Sitting on an ice pack can help.   e. Expect some drainage.  You often will have some blood or yellow drainage with open wounds.  Sometimes she will get a little   leaking of liquid stool until the incision/wounds have fully close down.  This should slow down by the end of the first week of surgery, but you will have occasional bleeding or drainage up to a few months after surgery.  Wear an absorbent pad or soft cotton gauze in your underwear until the drainage stops.  ACTIVITIES as tolerated:    You may resume regular (light) daily activities beginning the next day--such as daily self-care, walking, climbing stairs--gradually increasing activities as tolerated.  If you can walk 30 minutes without difficulty, it is safe to try more intense activity such as jogging, treadmill, bicycling, low-impact aerobics, swimming, etc. Save the most intensive and strenuous activity for last such as sit-ups, heavy lifting, contact sports, etc  Refrain from any heavy lifting or straining until you are off narcotics for pain control.   DO NOT PUSH THROUGH PAIN.  Let pain be your guide: If it hurts to do something, don't do it.  Pain is your body warning you to avoid that activity for another week until the pain goes down. You may drive when you are no longer taking  prescription pain medication, you can comfortably sit for long periods of time, and you can safely maneuver your car and apply brakes. You may have sexual intercourse when it is comfortable.   FOLLOW UP in our office Please call [facility] at [phone] to set up an appointment to see your surgeon in the office for a follow-up appointment approximately 3 weeks after your surgery. Make sure that you call for this appointment the day you arrive home to ensure a convenient appointment time.  8. IF YOU HAVE DISABILITY OR FAMILY LEAVE FORMS, BRING THEM TO THE OFFICE FOR PROCESSING.  DO NOT GIVE THEM TO YOUR DOCTOR.        WHEN TO CALL US [phone]: Poor pain control Reactions / problems with new medications (rash/itching, nausea, etc)  Fever over 101.5 F (38.5 C) Inability to urinate Nausea and/or vomiting Worsening swelling or bruising Continued bleeding from incision. Increased pain, redness, or drainage from the incision  The clinic staff is available to answer your questions during regular business hours (8:30am-5pm).  Please don't hesitate to call and ask to speak to one of our nurses for clinical concerns.   A surgeon from [facility] is always on call at the hospitals   If you have a medical emergency, go to the nearest emergency room or call 911.    [facility] [address], [address], [place], [place]  [address] ? MAIN: [phone] ? TOLL FREE: [phone] ? FAX [phone] [email or web address]  #####################################################  

## 2022-01-04 NOTE — Anesthesia Postprocedure Evaluation (Signed)
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  Last Vitals:  Vitals:   01/04/22 1300 01/04/22 1315  BP: (!) 103/59 (!) 102/56  Pulse: 91 82  Resp: 12 15  Temp:  36.9 C  SpO2: 98% 95%    Last Pain:  Vitals:   01/04/22 1300  PainSc: 4                  Kerina Simoneau,W. EDMOND

## 2022-01-04 NOTE — Anesthesia Procedure Notes (Signed)
Procedure Name: Intubation Date/Time: 01/04/2022 8:43 AM  Performed by: Victoriano Lain, CRNAPre-anesthesia Checklist: Patient identified, Emergency Drugs available, Suction available, Patient being monitored and Timeout performed Patient Re-evaluated:Patient Re-evaluated prior to induction Oxygen Delivery Method: Circle system utilized Preoxygenation: Pre-oxygenation with 100% oxygen Induction Type: IV induction Ventilation: Mask ventilation without difficulty Laryngoscope Size: Mac and 4 Grade View: Grade I Tube type: Oral Tube size: 7.5 mm Number of attempts: 1 Airway Equipment and Method: Stylet Placement Confirmation: ETT inserted through vocal cords under direct vision, positive ETCO2 and breath sounds checked- equal and bilateral Secured at: 21 cm Tube secured with: Tape Dental Injury: Teeth and Oropharynx as per pre-operative assessment

## 2022-01-04 NOTE — Transfer of Care (Signed)
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  Last Pain:  Vitals:   01/04/22 0727  PainSc: 0-No pain         Complications: No notable events documented.

## 2022-01-04 NOTE — H&P (Signed)
01/04/2022     REFERRING PHYSICIAN: Unknown  Patient Care Team: Rocky Morel, MD as PCP - General (Family Medicine) Johney Maine, Adrian Saran, MD as Consulting Provider (General Surgery) Patrice Paradise, MD (Gastroenterology)  PROVIDER: Hollace Kinnier, MD  [facility] MRN: [id number] DOB: 20-Jan-1971  SUBJECTIVE   Chief Complaint: New Patient   History of Present Illness: Amy Odonnell is a 51 y.o. female who is seen today  as an office consultation at the request of Dr. Candis Schatz  for evaluation of Rectal mass.   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 Found to have bulky soft polypoid mass in proximal rectum. 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 No high-grade dysplasia or malignancy. Cannot be seen on CAT scan but MRI confirmed large bulky pedunculated mass. Felt too large for standard colonoscopy and removal. Discussions made about endoscopic EMR. However felt by Dr. Stefani Dama already to be too bulky. Therefore recommendation made for transanal excision. Initially saw Dr. Dema Severin but he referred to me given my expertise.  Patient denies any personal family history of colon polyps or cancers. No prior abdominal surgery. She usually moves her bowels a couple times a day. However she has noticed some occasional bleeding and drainage when she strains to urinate and wonders that if it that is related to the polyp. She can walk several hours without difficulty. No diabetes or smoking. No sleep apnea. Had an appendectomy in 2003 when she lived in [place] on vacation. No other abdominal surgeries.  Medical History:  Past Medical History:  Diagnosis Date  Asthma, unspecified asthma severity, unspecified whether complicated, unspecified whether persistent   Patient Active Problem List  Diagnosis  Mass in rectum   Past Surgical History:  Procedure Laterality Date  APPENDECTOMY    Allergies  Allergen Reactions   Codeine Hives   No current outpatient medications on file prior to visit.   No current facility-administered medications on file prior to visit.   Family History  Problem Relation Age of Onset  Obesity Mother  Hyperlipidemia (Elevated cholesterol) Mother  Hyperlipidemia (Elevated cholesterol) Father    Social History   Tobacco Use  Smoking Status Never  Smokeless Tobacco Never    Social History   Socioeconomic History  Marital status: Single  Tobacco Use  Smoking status: Never  Smokeless tobacco: Never  Vaping Use  Vaping Use: Never used  Substance and Sexual Activity  Alcohol use: Never  Drug use: Never   ############################################################  Review of Systems: A complete review of systems (ROS) was obtained from the patient. I have reviewed this information and discussed as appropriate with the patient. See HPI as well for other pertinent ROS.  Constitutional: No fevers, chills, sweats. Weight stable Eyes: No vision changes, No discharge HENT: No sore throats, nasal drainage Lymph: No neck swelling, No bruising easily Pulmonary: No cough, productive sputum CV: No orthopnea, PND . No exertional chest/neck/shoulder/arm pain. Patient can walk 2-3 miles without difficulty.   GI: No personal nor family history of GI/colon cancer, inflammatory bowel disease, irritable bowel syndrome, allergy such as Celiac Sprue, dietary/dairy problems, colitis, ulcers nor gastritis. No recent sick contacts/gastroenteritis. No travel outside the country. No changes in diet.  Renal: No UTIs, No hematuria Genital: No drainage, bleeding, masses Musculoskeletal: No severe joint pain. Good ROM major joints Skin: No sores or lesions Heme/Lymph: No easy bleeding. No swollen lymph nodes Neuro: No active seizures. No facial droop Psych: No hallucinations. No agitation  OBJECTIVE   Vitals:  10/24/21 1518  BP: (!) 120/90  Pulse: (!) 113  Temp: 37.1 C (98.8 F)   SpO2: 97%  Weight: 87.9 kg (193 lb 12.8 oz)  Height: 170.2 cm ('5\' 7"'$ )   Body mass index is 30.35 kg/m.  PHYSICAL EXAM:  Constitutional: Not cachectic. Hygeine adequate. Vitals signs as above.  Eyes: Wears glasses - vision corrected,Pupils reactive, normal extraocular movements. Sclera nonicteric Neuro: CN II-XII intact. No major focal sensory defects. No major motor deficits. Lymph: No head/neck/groin lymphadenopathy Psych: No severe agitation. No severe anxiety. Judgment & insight Adequate, Oriented x4, HENT: Normocephalic, Mucus membranes moist. No thrush. Hearing: adequate Neck: Supple, No tracheal deviation. No obvious thyromegaly Chest: No pain to chest wall compression. Good respiratory excursion. No audible wheezing CV: Pulses intact. regular. No major extremity edema Ext: No obvious deformity or contracture. Edema: Not present. No cyanosis Skin: No major subcutaneous nodules. Warm and dry Musculoskeletal: Severe joint rigidity not present. No obvious clubbing. No digital petechiae. Mobility: no assist device moving easily without restrictions  Abdomen: Obese Soft. Nondistended. Nontender. Hernia: Not present. Diastasis recti: Not present. No hepatomegaly. No splenomegaly.  Genital/Pelvic: Inguinal hernia: Not present. Inguinal lymph nodes: without lymphadenopathy nor hidradenitis.   Rectal:   ##################################  Perianal skin Clean with good hygiene  Pruritis ani: Mild Pilonidal disease: Not present Condyloma / warts: Not present  Anal fissure: Not present Perirectal abscess/fistula Not present External hemorrhoids Not present  Digital and anoscopic rectal exam tolerated  Sphincter tone Normal  Hemorrhoidal piles Grade 1 normal Prostate: N/A Rectovaginal septum: N/A Rectal masses: Near the tip my finger ~7cm is a soft ellipsoid mass coming down from the rectum. I feel nothing on the anterior rectal wall up to 10 cm. Suspect this is coming from the  posterior rectal wall. I cannot reach the base.  Other significant findings:   Patient examined with patient in decubitus position .  ###################################    ###################################################################  Labs, Imaging and Diagnostic Testing:  Located in [facility]' section of Epic EMR chart  PRIOR [facility] CLINIC NOTES:  Not applicable  SURGERY NOTES:  Not applicable  PATHOLOGY:  Located in [facility]' section of Epic EMR chart  Assessment and Plan:  DIAGNOSES:  Diagnoses and all orders for this visit:  Mass in rectum    Rectum, polyp(s) - VILLOUS ADENOMA(S) - NEGATIVE FOR HIGH-GRADE DYSPLASIA OR MALIGNANCY ASSESSMENT/PLAN  Pleasant woman with large pedunculated soft mass in proximal rectum. Probably posterior wall by MRI and endoscopy and digital exam. Numerous biopsies show villous adenomata without any high-grade dysplasia. MRI showing no obvious invasion.  This mass needs to be removed. Felt to be too large to safely do by EMR record and Dr. Rush Landmark. Seems mostly pedunculated with the base going over the proximal rectal fold. May be challenging but should be able to safely do it. We will see. Reasonable candidate for transanal excision using robotic TAMIS or TEM. Possible conversion to low anterior section but seems less likely.  The anatomy & physiology of the digestive tract was discussed. The pathophysiology of the rectal pathology was discussed. Natural history risks without surgery was discussed. I feel the risks of no intervention will lead to serious problems that outweigh the operative risks; therefore, I recommended surgery.   Laparoscopic & open abdominal techniques were discussed. I recommended we start with a partial proctectomy by transanal endoscopic microsurgery (TEM) vs transanal minimally invasive surgery (TAMIS) for excisional biopsy to remove the pathology and hopefully cure and/or control the  pathology. This technique can offer  less operative risk and faster post-operative recovery. Possible need for immediate or later abdominal surgery for further treatment was discussed.   Risks such as bleeding, abscess, reoperation, ostomy, heart attack, death, and other risks were discussed. I noted a good likelihood this will help address the problem. Goals of post-operative recovery were discussed as well. We will work to minimize complications. An educational handout was given as well. Questions were answered. The patient expresses understanding & wishes to proceed with surgery.     Adin Hector, MD, FACS, MASCRS Esophageal, Gastrointestinal & Colorectal Surgery Robotic and Minimally Invasive Surgery  [facility] [address] [place], [place] [address] [phone] Fax [phone] Main  CONTACT INFORMATION:  Weekday (9AM-5PM): Call [facility] main office at [phone]  Weeknight (5PM-9AM) or Weekend/Holiday: Check [email or web address] (password " [email or web address]") for General Surgery [facility] coverage  (Please, do not use SecureChat as it is not reliable communication to reach operating surgeons for immediate patient care)

## 2022-01-04 NOTE — Op Note (Signed)
01/04/2022  12:08 PM  PATIENT:  Amy Odonnell  51 y.o. female  Patient Care Team: Caren Macadam, MD (Inactive) as PCP - General (Family Medicine) Michael Boston, MD as Consulting Physician (General Surgery) Daryel November, MD as Consulting Physician (Gastroenterology)  PRE-OPERATIVE DIAGNOSIS:  RECTAL POLYP  POST-OPERATIVE DIAGNOSIS:  ADENOMATOUS RECTAL POLYP  PROCEDURE:  ROBOTIC TAMIS PARTIAL PROTECTOMY OF RECTAL MASS  SURGEON:  Adin Hector, MD  ASSISTANT: OR Staff   ANESTHESIA:   local and general  EBL:  Total I/O In: 1792.5 [I.V.:1692.5; IV Piggyback:100] Out: 180 [Urine:130; Blood:50]  Delay start of Pharmacological VTE agent (>24hrs) due to surgical blood loss or risk of bleeding:  no  DRAINS: none   SPECIMEN: Proximal rectal polyp involving the right lateral and posterior wall.  Base involving third of the circumference.  (see OR findings below)  DISPOSITION OF SPECIMEN:  PATHOLOGY  COUNTS:  YES  PLAN OF CARE: Admit for overnight observation  PATIENT DISPOSITION:  PACU - hemodynamically stable.  INDICATION: Pleasant woman undergoing screening colonoscopy and found to have bulky mass in proximal rectum.  Biopsies consistent with adenomatous tissues.  Did not appear to be frankly cancerous.  Surgical consultation requested.  I offered partial proctectomy by minimal invasive resection.  The anatomy & physiology of the digestive tract was discussed.  The pathophysiology of the rectal pathology was discussed.  Natural history risks without surgery was discussed.   I feel the risks of no intervention will lead to serious problems that outweigh the operative risks; therefore, I recommended surgery.    Laparoscopic & open abdominal techniques were discussed.  I recommended we start with a partial proctectomy by transanal endoscopic microsurgery (TEM) for excisional biopsy to remove the pathology and hopefully cure and/or control the pathology.  This technique  can offer less operative risk and faster post-operative recovery.  Possible need for immediate or later abdominal surgery for further treatment was discussed.   Risks such as bleeding, abscess, reoperation, ostomy, heart attack, death, and other risks were discussed.   I noted a good likelihood this will help address the problem.  Goals of post-operative recovery were discussed as well.  We will work to minimize complications.  An educational handout was given as well.  Questions were answered.  The patient expresses understanding & wishes to proceed with surgery.  OR FINDINGS: Bulky soft polypoid mass in the proximal rectal fold 10-13 cm from anal verge.  4 x 4 centimeter base with more protuberant frond polypoid features.  Soft.  The resulting mass was 6x6 cm in size  Pin placement on pathology specimen: Proximal margin: Pink Distal margin:"Purple Right anterior: Red Left posterior :  Yellow  The closure rests 9-11cm from the anal verge right anterior to left posterior.  It is 50% of the circumference.   DESCRIPTION:   Informed consent was confirmed.  Patient received general anesthesia without difficulty.  Foley catheter sterilely placed.  Sequential compression devices active during the entire case.  The patient was placed in the low lithotomyposition, taking extra care to secure and protect the patient appropriately.  The perineum and perianal regions were prepped and draped in a sterile fashion.  Surgical timeout confirmed our plan.  I did a gentle digital rectal examination with gradual anal dilation to allow placement of the Gelpoint system into the rectum.  This was additionally secured to the perineum using interrupted suture -later switched to a pursestring type suture for better seal.  Endoluminal insufflation to 20 mm to  good result.  I placed a 8 mm robotic port anteriorly and used the robotic camera to confirm I had the area of concern in view.  Xi robot brought in and docked to  the camera port.  I carefully placed additional ports right lateral and left lateral.  Robotic instruments placed.  I could easily identify the mass.  I went ahead and used cautery to mark 1 cm margins circumferentially.  I then did a full thickness transection at the distal margin.  I came around laterally.  Lifted the specimen off the posterior mesorectum and rectosigmoid mesentery and right lateral sidewall for a good deep margin.  I gradually came more proximally.  Transected at the proximal margin.  I ensured hemostasis.  I inspected the main specimen and pinned it on thick cork board.  Pins as noted above.  I walked the specimen down to pathology and showed the [facility] pathology team for proper orientation.    I went back in and scrubbed in.  Hemostasis was good.  I reapproximated the wound transversely with a V-lock serrated absorbable suture.  A lot of billowing and visualization was challenged.  I did place a varies needle send them some distended and a 5 mm port in the left subcostal ridge carefully.  Got really no return of insufflation.  Had to go in and redo a pursestring on the perineum to get a better seal on the gelpoint since her tone had decreased and could not get as good of a seal at the anal canal.  With better sealing,I got much better insufflation.  I closed the wound in a running fashion from the left posterior corner to the right anterior corner.  I did have a fraction of the suture close to the needle, so I ran another 2 OV lock from the middle part of the closure to the edge to reinforce it to good result.  This brought things together well.   I did meticulous inspection with fine tip instruments to confirm good watertight closure   Hemostasis excellent.  The lumen was narrowed by the closure at the proximal rectal fold but could easily dilate 3-4 cm. Insufflation evacuated & instruments removed.  Endoluminal gas evacuated.  Abdomen soft.  Left upper quadrant port  site inspected and  confirmed no injury.  Port removed and dermis closed with Monocryl similar suture.  Sterile dressing applied  The patient is being extubated go to recovery room.  I discussed operative findings, updated the patient's status, discussed probable steps to recovery, and gave postoperative recommendations to the  patient's mother, Trenity Pha .  Recommendations were made.  Questions were answered.  She expressed understanding & appreciation.   Adin Hector, MD, FACS, MASCRS Gastrointestinal and Minimally Invasive Surgery    [address], [place], [place] [address] [phone] Main / Paging [phone] Fax

## 2022-01-05 ENCOUNTER — Encounter (HOSPITAL_COMMUNITY): Payer: Self-pay | Admitting: Surgery

## 2022-01-05 DIAGNOSIS — Z885 Allergy status to narcotic agent status: Secondary | ICD-10-CM | POA: Diagnosis not present

## 2022-01-05 DIAGNOSIS — Z8249 Family history of ischemic heart disease and other diseases of the circulatory system: Secondary | ICD-10-CM | POA: Diagnosis not present

## 2022-01-05 DIAGNOSIS — Z833 Family history of diabetes mellitus: Secondary | ICD-10-CM | POA: Diagnosis not present

## 2022-01-05 DIAGNOSIS — J45909 Unspecified asthma, uncomplicated: Secondary | ICD-10-CM | POA: Diagnosis present

## 2022-01-05 DIAGNOSIS — D128 Benign neoplasm of rectum: Secondary | ICD-10-CM | POA: Diagnosis present

## 2022-01-05 DIAGNOSIS — K621 Rectal polyp: Secondary | ICD-10-CM | POA: Diagnosis present

## 2022-01-05 DIAGNOSIS — Z83438 Family history of other disorder of lipoprotein metabolism and other lipidemia: Secondary | ICD-10-CM | POA: Diagnosis not present

## 2022-01-05 LAB — BASIC METABOLIC PANEL
Anion gap: 8 (ref 5–15)
BUN: 7 mg/dL (ref 6–20)
CO2: 23 mmol/L (ref 22–32)
Calcium: 8.1 mg/dL — ABNORMAL LOW (ref 8.9–10.3)
Chloride: 100 mmol/L (ref 98–111)
Creatinine, Ser: 0.8 mg/dL (ref 0.44–1.00)
GFR, Estimated: >60 mL/min (ref >60)
Glucose, Bld: 130 mg/dL — ABNORMAL HIGH (ref 70–99)
Potassium: 3.8 mmol/L (ref 3.5–5.1)
Sodium: 131 mmol/L — ABNORMAL LOW (ref 135–145)

## 2022-01-05 LAB — CBC
HCT: 30.5 % — ABNORMAL LOW (ref 36.0–46.0)
Hemoglobin: 10.2 g/dL — ABNORMAL LOW (ref 12.0–15.0)
MCH: 30.2 pg (ref 26.0–34.0)
MCHC: 33.4 g/dL (ref 30.0–36.0)
MCV: 90.2 fL (ref 80.0–100.0)
Platelets: 324 10*3/uL (ref 150–400)
RBC: 3.38 MIL/uL — ABNORMAL LOW (ref 3.87–5.11)
RDW: 12.5 % (ref 11.5–15.5)
WBC: 19.7 10*3/uL — ABNORMAL HIGH (ref 4.0–10.5)
nRBC: 0 % (ref 0.0–0.2)

## 2022-01-05 LAB — MAGNESIUM: Magnesium: 1.9 mg/dL (ref 1.7–2.4)

## 2022-01-05 LAB — SURGICAL PATHOLOGY

## 2022-01-05 NOTE — Progress Notes (Signed)
  Transition of Care [facility]) Screening Note   Patient Details  Name: Amy Odonnell Date of Birth: 02/20/1971   Transition of Care [facility]) CM/SW Contact:    Lennart Pall, LCSW Phone Number: 01/05/2022, 9:34 AM    Transition of Care Department [facility]) has reviewed patient and no TOC needs have been identified at this time. We will continue to monitor patient advancement through interdisciplinary progression rounds. If new patient transition needs arise, please place a TOC consult.

## 2022-01-05 NOTE — Discharge Summary (Signed)
Physician Discharge Summary    Patient ID: Amy Odonnell MRN: [id number] DOB/AGE: Sep 29, 1970  51 y.o.  Patient Care Team: Caren Macadam, MD (Inactive) as PCP - General (Family Medicine) Michael Boston, MD as Consulting Physician (General Surgery) Daryel November, MD as Consulting Physician (Gastroenterology)  Admit date: 01/04/2022  Discharge date: 01/05/2022  Hospital Stay = 1 days    Discharge Diagnoses:  Principal Problem:   Mass in rectum Active Problems:   Asthma   1 Day Post-Op  01/04/2022  POST-OPERATIVE DIAGNOSIS:  ADENOMATOUS RECTAL POLYP   PROCEDURE:  ROBOTIC TAMIS PARTIAL PROTECTOMY OF RECTAL MASS   SURGEON:  Adin Hector, MD  OR FINDINGS: Bulky soft polypoid mass in the proximal rectal fold 10-13 cm from anal verge.  4 x 4 centimeter base with more protuberant frond polypoid features.  Soft.   The resulting mass was 6x6 cm in size   Pin placement on pathology specimen: Proximal margin: Pink Distal margin:"Purple Right anterior: Red Left posterior :  Yellow   The closure rests 9-11cm from the anal verge right anterior to left posterior.  It is 50% of the circumference.  Consults: NONE  Hospital Course:   The patient underwent the surgery above.  Postoperatively, the patient gradually mobilized and advanced to a solid diet.  Pain and other symptoms were treated aggressively.    By the time of discharge, the patient was walking well the hallways, eating food, having flatus.  Pain was well-controlled on an oral medications.  Based on meeting discharge criteria and continuing to recover, I felt it was safe for the patient to be discharged from the hospital to further recover with close followup. Postoperative recommendations were discussed in detail.  They are written as well.  Discharged Condition: good  Discharge Exam: Blood pressure 106/61, pulse 64, temperature 98.9 F (37.2 C), temperature source Oral, resp. rate 18, height '5\' 7"'$  (1.702  m), weight 89.2 kg, last menstrual period 12/09/2021, SpO2 97 %.  General: Pt awake/alert/oriented x4 in No acute distress Eyes: PERRL, normal EOM.  Sclera clear.  No icterus Neuro: CN II-XII intact w/o focal sensory/motor deficits. Lymph: No head/neck/groin lymphadenopathy Psych:  No delerium/psychosis/paranoia HENT: Normocephalic, Mucus membranes moist.  No thrush Neck: Supple, No tracheal deviation Chest:  No chest wall pain w good excursion CV:  Pulses intact.  Regular rhythm MS: Normal AROM mjr joints.  No obvious deformity Abdomen: Soft.  Nondistended.  Nontender.  No evidence of peritonitis.  No incarcerated hernias. Ext:  SCDs BLE.  No mjr edema.  No cyanosis Skin: No petechiae / purpura   Disposition:    Follow-up Information     Michael Boston, MD Follow up in 3 week(s).   Specialties: General Surgery, Colon and Rectal Surgery Why: To follow up after your operation Contact information: [place] [address] [phone]                 Discharge disposition: 01-Home or Self Care       Discharge Instructions     Call MD for:  hives   Complete by: As directed    Call MD for:  persistant dizziness or light-headedness   Complete by: As directed    Call MD for:  persistant nausea and vomiting   Complete by: As directed    Call MD for:  redness, tenderness, or signs of infection (pain, swelling, redness, odor or green/yellow discharge around incision site)   Complete by: As directed  You will often notice bleeding with bowel movements.  Expect some yellow or tan drainage.  This can occur for weeks, but should be mild by the end of the first week of surgery.  Wear an absorbent pad or soft cotton gauze in your underwear until the drainage stops   Call MD for:  severe uncontrolled pain   Complete by: As directed    Diet - low sodium heart healthy   Complete by: As directed    Discharge wound care:   Complete by: As directed     THERE IS NO INTERNAL PACKING.  -Allow the fluffed gauze that lays on the skin to come off with the 1st bowel movement.  Reinforce as needed if you have bleeding or soiling  -Bathe / shower every day.  Just comfortable warm water.  Avoid salts/soaps.  Keep the area clean by showering / bathing over the perianal region.   Squeeze bottles of water work as well,.  It is okay to bathe even with an open wound to help wash it.  Can try ice packs as well.  -Wet wipes or showers / gentle washing after bowel movements is often less traumatic than regular toilet paper.  Consider using a squeeze bottle of warm water to rinse the perianal region.  -You will notice bleeding, yellow drainage, and occasional stool leaking, especially with bowel movements.  This should slow down by the end of the first week of surgery, but expect some drainage for many weeks.  Replace cotton balls/pads as needed  Wear an absorbent pad or soft cotton balls on the anus as needed to catch any drainage and help keep the area clean and dry.  This is less sticking/painful than regular gauze   Driving Restrictions   Complete by: As directed    You may drive when you are no longer taking prescription pain medication, you can comfortably sit for long periods of time, and you can safely maneuver your car and apply brakes.  Expect at least 1-2 weeks   Increase activity slowly   Complete by: As directed    Lifting restrictions   Complete by: As directed    You may resume regular (light) daily activities beginning the next day-such as daily self-care, walking, climbing stairs-gradually increasing activities as tolerated.  If you can walk 30 minutes without difficulty, it is safe to try more intense activity such as jogging, treadmill, bicycling, low-impact aerobics, swimming, etc. Save the most intensive and strenuous activity for last such as sit-ups, heavy lifting, contact sports, etc  Refrain from any heavy lifting or straining until you  are off narcotics for pain control.  Remember: if it hurts to do it, don't do it: STOP   May shower / Bathe   Complete by: As directed    Warm water sitz baths/tub soaks/showers  x 20-30 minutes, up to 8 times a day for comfort  Just comfortable warm water.  Avoid salts/soaps.  Can try using ice packs as an alternative.  Consider using a squeeze bottle of warm water to rinse the perianal region.   May walk up steps   Complete by: As directed    Sexual Activity Restrictions   Complete by: As directed    You may have sexual intercourse when it is comfortable. If it hurts to do it, STOP.       Allergies as of 01/05/2022       Reactions   Codeine Hives        Medication List  TAKE these medications    AeroChamber Plus inhaler Use as instructed   albuterol 108 (90 Base) MCG/ACT inhaler Commonly known as: VENTOLIN HFA Inhale 2 puffs into the lungs every 6 (six) hours as needed for wheezing or shortness of breath.   fluticasone 50 MCG/ACT nasal spray Commonly known as: FLONASE Place 1 spray into both nostrils daily as needed for allergies or rhinitis.   loratadine 10 MG tablet Commonly known as: CLARITIN Take 10 mg by mouth daily.   norgestimate-ethinyl estradiol 0.25-35 MG-MCG tablet Commonly known as: ORTHO-CYCLEN TAKE 1 TABLET BY MOUTH EVERY DAY   oxyCODONE 5 MG immediate release tablet Commonly known as: Oxy IR/ROXICODONE Take 1 tablet (5 mg total) by mouth every 6 (six) hours as needed for moderate pain, severe pain or breakthrough pain.   valACYclovir 500 MG tablet Commonly known as: Valtrex Take 1 tablet (500 mg total) by mouth 2 (two) times daily.               Discharge Care Instructions  (From admission, onward)           Start     Ordered   01/04/22 0000  Discharge wound care:       Comments: THERE IS NO INTERNAL PACKING.  -Allow the fluffed gauze that lays on the skin to come off with the 1st bowel movement.  Reinforce as needed if you  have bleeding or soiling  -Bathe / shower every day.  Just comfortable warm water.  Avoid salts/soaps.  Keep the area clean by showering / bathing over the perianal region.   Squeeze bottles of water work as well,.  It is okay to bathe even with an open wound to help wash it.  Can try ice packs as well.  -Wet wipes or showers / gentle washing after bowel movements is often less traumatic than regular toilet paper.  Consider using a squeeze bottle of warm water to rinse the perianal region.  -You will notice bleeding, yellow drainage, and occasional stool leaking, especially with bowel movements.  This should slow down by the end of the first week of surgery, but expect some drainage for many weeks.  Replace cotton balls/pads as needed  Wear an absorbent pad or soft cotton balls on the anus as needed to catch any drainage and help keep the area clean and dry.  This is less sticking/painful than regular gauze   01/04/22 0739            Significant Diagnostic Studies:  Results for orders placed or performed during the hospital encounter of 01/04/22 (from the past 72 hour(s))  ABO/Rh     Status: None   Collection Time: 01/04/22  7:05 AM  Result Value Ref Range   ABO/RH(D)      A POS Performed at [facility], [place] [address], [place], [place] [address]   Basic metabolic panel     Status: Abnormal   Collection Time: 01/05/22  4:38 AM  Result Value Ref Range   Sodium 131 (L) 135 - 145 mmol/L   Potassium 3.8 3.5 - 5.1 mmol/L   Chloride 100 98 - 111 mmol/L   CO2 23 22 - 32 mmol/L   Glucose, Bld 130 (H) 70 - 99 mg/dL    Comment: Glucose reference range applies only to samples taken after fasting for at least 8 hours.   BUN 7 6 - 20 mg/dL   Creatinine, Ser 0.80 0.44 - 1.00 mg/dL   Calcium 8.1 (L) 8.9 -  10.3 mg/dL   GFR, Estimated >60 >60 mL/min    Comment: (NOTE) Calculated using the CKD-EPI Creatinine Equation (2021)    Anion gap 8 5 - 15    Comment: Performed at  [facility], [place] [address] [place], [place] [address]  CBC     Status: Abnormal   Collection Time: 01/05/22  4:38 AM  Result Value Ref Range   WBC 19.7 (H) 4.0 - 10.5 K/uL   RBC 3.38 (L) 3.87 - 5.11 MIL/uL   Hemoglobin 10.2 (L) 12.0 - 15.0 g/dL   HCT 30.5 (L) 36.0 - 46.0 %   MCV 90.2 80.0 - 100.0 fL   MCH 30.2 26.0 - 34.0 pg   MCHC 33.4 30.0 - 36.0 g/dL   RDW 12.5 11.5 - 15.5 %   Platelets 324 150 - 400 K/uL   nRBC 0.0 0.0 - 0.2 %    Comment: Performed at [facility], [place] [address] [place], [place] [address]  Magnesium     Status: None   Collection Time: 01/05/22  4:38 AM  Result Value Ref Range   Magnesium 1.9 1.7 - 2.4 mg/dL    Comment: Performed at [facility], [place] [address], [place], [place] [address]    No results found.  Past Medical History:  Diagnosis Date   Allergy    SEASONAL   Asthma    Migraine    Pneumonia    Seasonal allergies     Past Surgical History:  Procedure Laterality Date   APPENDECTOMY     COLONOSCOPY     LAPAROSCOPIC PARTIAL PROTECTOMY N/A 01/04/2022   Procedure: ROBOTIC TAMIS PARTIAL PROTECTOMY OF RECTAL MASS;  Surgeon: Michael Boston, MD;  Location: [facility];  Service: General;  Laterality: N/A;    Social History   Socioeconomic History   Marital status: Single    Spouse name: Not on file   Number of children: Not on file   Years of education: Not on file   Highest education level: Not on file  Occupational History   Occupation: nurse practitioner  Tobacco Use   Smoking status: Never    Passive exposure: Never   Smokeless tobacco: Never  Vaping Use   Vaping Use: Never used  Substance and Sexual Activity   Alcohol use: Not Currently   Drug use: No   Sexual activity: Yes    Birth control/protection: Pill  Other Topics Concern   Not on file  Social History Narrative   Finished PhD in Nursing in 09/2016.    Nurse Practitioner   Single, 2 adopted children - born 2011 &  2015 - in [place]. 04/02/2017    Social Determinants of Health   Financial Resource Strain: Not on file  Food Insecurity: Not on file  Transportation Needs: Not on file  Physical Activity: Not on file  Stress: Not on file  Social Connections: Not on file  Intimate Partner Violence: Not on file    Family History  Problem Relation Age of Onset   Asthma Mother    Hypothyroidism Mother    Obesity Mother    Depression Mother    Arthritis Mother    Hyperlipidemia Father    Other Father        hyperglycemia   Depression Sister    Diabetes Maternal Grandmother    Hypertension Maternal Grandmother    Colon cancer Neg Hx    Colon polyps Neg Hx    Crohn's disease Neg Hx  Esophageal cancer Neg Hx    Rectal cancer Neg Hx    Stomach cancer Neg Hx    Breast cancer Neg Hx     Current Facility-Administered Medications  Medication Dose Route Frequency Provider Last Rate Last Admin   0.9 %  sodium chloride infusion  250 mL Intravenous PRN Michael Boston, MD       acetaminophen (TYLENOL) tablet 1,000 mg  1,000 mg Oral Lajuana Ripple, MD   1,000 mg at 01/05/22 0217   albuterol (PROVENTIL) (2.5 MG/3ML) 0.083% nebulizer solution 2.5 mg  2.5 mg Nebulization Q6H PRN Michael Boston, MD       alum & mag hydroxide-simeth (MAALOX/MYLANTA) 200-200-20 MG/5ML suspension 30 mL  30 mL Oral Q6H PRN Michael Boston, MD       alvimopan (ENTEREG) capsule 12 mg  12 mg Oral BID Michael Boston, MD       diazepam (VALIUM) tablet 5 mg  5 mg Oral Q6H PRN Michael Boston, MD       diphenhydrAMINE (BENADRYL) 12.5 MG/5ML elixir 12.5 mg  12.5 mg Oral Q6H PRN Michael Boston, MD       Or   diphenhydrAMINE (BENADRYL) injection 12.5 mg  12.5 mg Intravenous Q6H PRN Michael Boston, MD       enoxaparin (LOVENOX) injection 40 mg  40 mg Subcutaneous Q24H Michael Boston, MD       feeding supplement (ENSURE SURGERY) liquid 237 mL  237 mL Oral BID BM Michael Boston, MD       fluticasone (FLONASE) 50 MCG/ACT nasal spray 1  spray  1 spray Each Nare Daily PRN Michael Boston, MD       hydrALAZINE (APRESOLINE) injection 10 mg  10 mg Intravenous Q2H PRN Michael Boston, MD       hydrocortisone-pramoxine [facility]) 2.5-1 % rectal cream   Rectal Q6H PRN Michael Boston, MD       HYDROmorphone (DILAUDID) injection 0.5-2 mg  0.5-2 mg Intravenous Q4H PRN Michael Boston, MD       lactated ringers bolus 1,000 mL  1,000 mL Intravenous Q8H PRN Michael Boston, MD       lip balm (CARMEX) ointment   Topical BID Michael Boston, MD   1 Application at 48/18/56 2030   loratadine (CLARITIN) tablet 10 mg  10 mg Oral Daily Michael Boston, MD       magic mouthwash  15 mL Oral QID PRN Michael Boston, MD       melatonin tablet 3 mg  3 mg Oral QHS PRN Michael Boston, MD       methocarbamol (ROBAXIN) 1,000 mg in dextrose 5 % 100 mL IVPB  1,000 mg Intravenous Q6H PRN Michael Boston, MD       methocarbamol (ROBAXIN) tablet 1,000 mg  1,000 mg Oral Q6H PRN Michael Boston, MD       metoprolol tartrate (LOPRESSOR) injection 5 mg  5 mg Intravenous Q6H PRN Michael Boston, MD       ondansetron [facility]) tablet 4 mg  4 mg Oral Q6H PRN Michael Boston, MD       Or   ondansetron [facility]) injection 4 mg  4 mg Intravenous Q6H PRN Michael Boston, MD       oxyCODONE (Oxy IR/ROXICODONE) immediate release tablet 5-10 mg  5-10 mg Oral Q4H PRN Michael Boston, MD   5 mg at 01/04/22 1643   polycarbophil (FIBERCON) tablet 625 mg  625 mg Oral BID Michael Boston, MD   625 mg at 01/04/22 2026   prochlorperazine (COMPAZINE)  tablet 10 mg  10 mg Oral Q6H PRN Michael Boston, MD       Or   prochlorperazine (COMPAZINE) injection 5-10 mg  5-10 mg Intravenous Q6H PRN Michael Boston, MD       simethicone (MYLICON) chewable tablet 40 mg  40 mg Oral Q6H PRN Michael Boston, MD       sodium chloride flush (NS) 0.9 % injection 3 mL  3 mL Intravenous Gorden Harms, MD       sodium chloride flush (NS) 0.9 % injection 3 mL  3 mL Intravenous PRN Michael Boston, MD       witch hazel-glycerin (TUCKS)  pad   Topical PRN Michael Boston, MD         Allergies  Allergen Reactions   Codeine Hives    Signed:   Adin Hector, MD, FACS, MASCRS Esophageal, Gastrointestinal & Colorectal Surgery Robotic and Minimally Invasive Surgery  [facility] [place] [address] [place], [place] [address] [phone] Fax [phone] Main  CONTACT INFORMATION:  Weekday (9AM-5PM): Call [facility] main office at [phone]  Weeknight (5PM-9AM) or Weekend/Holiday: Check [email or web address] (password " [email or web address]") for General Surgery [facility] coverage  (Please, do not use SecureChat as it is not reliable communication to reach operating surgeons for immediate patient care)      01/05/2022, 7:37 AM

## 2022-01-30 ENCOUNTER — Encounter: Payer: Self-pay | Admitting: Gastroenterology

## 2022-01-30 DIAGNOSIS — D128 Benign neoplasm of rectum: Secondary | ICD-10-CM | POA: Insufficient documentation

## 2022-03-09 ENCOUNTER — Other Ambulatory Visit (HOSPITAL_COMMUNITY): Payer: Self-pay

## 2022-03-13 ENCOUNTER — Other Ambulatory Visit: Payer: Self-pay | Admitting: *Deleted

## 2022-03-13 DIAGNOSIS — N924 Excessive bleeding in the premenopausal period: Secondary | ICD-10-CM

## 2022-03-13 DIAGNOSIS — Z30011 Encounter for initial prescription of contraceptive pills: Secondary | ICD-10-CM

## 2022-03-13 MED ORDER — NORGESTIMATE-ETH ESTRADIOL 0.25-35 MG-MCG PO TABS
1.0000 | ORAL_TABLET | Freq: Every day | ORAL | 10 refills | Status: DC
  Filled 2022-04-07: qty 28, 28d supply, fill #0

## 2022-03-22 ENCOUNTER — Other Ambulatory Visit (HOSPITAL_COMMUNITY): Payer: Self-pay

## 2022-03-23 ENCOUNTER — Other Ambulatory Visit (HOSPITAL_COMMUNITY): Payer: Self-pay

## 2022-03-28 ENCOUNTER — Encounter: Payer: Self-pay | Admitting: Family Medicine

## 2022-03-28 ENCOUNTER — Ambulatory Visit: Payer: 59 | Admitting: Family Medicine

## 2022-03-28 VITALS — BP 120/92 | HR 85 | Temp 98.2°F | Ht 67.0 in | Wt 195.1 lb

## 2022-03-28 DIAGNOSIS — J4520 Mild intermittent asthma, uncomplicated: Secondary | ICD-10-CM | POA: Diagnosis not present

## 2022-03-28 NOTE — Progress Notes (Signed)
   Established Patient Office Visit  Subjective   Patient ID: Amy Odonnell, female    DOB: 12-14-1970  Age: 51 y.o. MRN: [id number]  Chief Complaint  Patient presents with  . Establish Care    Pt is here for transition of care visit.   Mild intermittent asthma -- pt reports she only uses albuterol as needed, very rare that she does need it. Pt is currently getting over a cold and so there has been some coughing recently, no fever/chills, no chest pain.   Seasonal allergies- spring and fall, states that she is well controlled on the claritin and flonase.   Perimenopause-- patient is currently on OCP's, states that these were started due to severe irregular bleeding when she started going through menopause. States that she feels like her periods feel different now, thinks she might have gone through the change but she is not sure. We discussed that OCPs can be considered safe through the 50's.   Current Outpatient Medications  Medication Instructions  . albuterol (VENTOLIN HFA) 108 (90 Base) MCG/ACT inhaler 2 puffs, Inhalation, Every 6 hours PRN  . fluticasone (FLONASE) 50 MCG/ACT nasal spray 1 spray, Each Nare, Daily PRN  . loratadine (CLARITIN) 10 mg, Oral, Daily  . norgestimate-ethinyl estradiol (ORTHO-CYCLEN) 0.25-35 MG-MCG tablet 1 tablet, Oral, Daily  . Spacer/Aero-Holding Chambers (AEROCHAMBER PLUS) inhaler Use as instructed  . valACYclovir (VALTREX) 500 mg, Oral, 2 times daily       Review of Systems  All other systems reviewed and are negative.     Objective:     BP (!) 120/92 (BP Location: Left Arm, Patient Position: Sitting, Cuff Size: Normal)   Pulse 85   Temp 98.2 F (36.8 C) (Oral)   Ht '5\' 7"'$  (1.702 m)   Wt 195 lb 1.6 oz (88.5 kg)   LMP 03/13/2022 (Exact Date)   SpO2 98%   BMI 30.56 kg/m  {Vitals History (Optional):23777}  Physical Exam Vitals reviewed.  Constitutional:      Appearance: Normal appearance. She is well-groomed and normal weight.  Eyes:      Conjunctiva/sclera: Conjunctivae normal.  Neck:     Thyroid: No thyromegaly.  Cardiovascular:     Rate and Rhythm: Normal rate and regular rhythm.     Pulses: Normal pulses.     Heart sounds: S1 normal and S2 normal.  Pulmonary:     Effort: Pulmonary effort is normal.     Breath sounds: Normal breath sounds and air entry.  Abdominal:     General: Bowel sounds are normal.  Musculoskeletal:     Right lower leg: No edema.     Left lower leg: No edema.  Neurological:     Mental Status: She is alert and oriented to person, place, and time. Mental status is at baseline.     Gait: Gait is intact.  Psychiatric:        Mood and Affect: Mood and affect normal.        Speech: Speech normal.        Behavior: Behavior normal.        Judgment: Judgment normal.     No results found for any visits on 03/28/22.  {Labs (Optional):23779}  The 10-year ASCVD risk score (Arnett DK, et al., 2019) is: 1.3%    Assessment & Plan:   Problem List Items Addressed This Visit   None   No follow-ups on file.    Farrel Conners, MD

## 2022-03-29 NOTE — Assessment & Plan Note (Signed)
Lungs clear on exam, well controlled. Continue PRN albuterol inhaler.

## 2022-04-07 ENCOUNTER — Other Ambulatory Visit (HOSPITAL_COMMUNITY): Payer: Self-pay

## 2022-05-01 ENCOUNTER — Encounter: Payer: Self-pay | Admitting: Family Medicine

## 2022-05-01 DIAGNOSIS — Z30011 Encounter for initial prescription of contraceptive pills: Secondary | ICD-10-CM

## 2022-05-01 DIAGNOSIS — N924 Excessive bleeding in the premenopausal period: Secondary | ICD-10-CM

## 2022-05-01 MED ORDER — NORGESTIMATE-ETH ESTRADIOL 0.25-35 MG-MCG PO TABS: 1.0000 | ORAL_TABLET | Freq: Every day | ORAL | 0 refills | Status: DC

## 2022-06-13 ENCOUNTER — Ambulatory Visit (INDEPENDENT_AMBULATORY_CARE_PROVIDER_SITE_OTHER): Payer: 59

## 2022-06-13 DIAGNOSIS — Z23 Encounter for immunization: Secondary | ICD-10-CM

## 2022-06-13 NOTE — Progress Notes (Signed)
Per orders of Dr. Legrand Como, injection of Shingrix given by Franco Collet. Patient tolerated injection well.

## 2022-06-25 ENCOUNTER — Other Ambulatory Visit: Payer: Self-pay | Admitting: Family Medicine

## 2022-06-25 DIAGNOSIS — Z30011 Encounter for initial prescription of contraceptive pills: Secondary | ICD-10-CM

## 2022-06-25 DIAGNOSIS — N924 Excessive bleeding in the premenopausal period: Secondary | ICD-10-CM

## 2022-07-24 ENCOUNTER — Telehealth: Payer: 59 | Admitting: Physician Assistant

## 2022-07-24 DIAGNOSIS — B9689 Other specified bacterial agents as the cause of diseases classified elsewhere: Secondary | ICD-10-CM

## 2022-07-24 DIAGNOSIS — J208 Acute bronchitis due to other specified organisms: Secondary | ICD-10-CM

## 2022-07-24 MED ORDER — PREDNISONE 20 MG PO TABS: 40.0000 mg | ORAL_TABLET | Freq: Every day | ORAL | 0 refills | Status: DC

## 2022-07-24 MED ORDER — AMOXICILLIN-POT CLAVULANATE 875-125 MG PO TABS: 1.0000 | ORAL_TABLET | Freq: Two times a day (BID) | ORAL | 0 refills | Status: DC

## 2022-07-24 MED ORDER — SPACER/AERO-HOLDING CHAMBERS DEVI: 0 refills | Status: AC

## 2022-07-24 MED ORDER — ALBUTEROL SULFATE HFA 108 (90 BASE) MCG/ACT IN AERS: 1.0000 | INHALATION_SPRAY | Freq: Four times a day (QID) | RESPIRATORY_TRACT | 0 refills | Status: AC | PRN

## 2022-07-24 NOTE — Progress Notes (Signed)
Virtual Visit Consent   Amy Odonnell, you are scheduled for a virtual visit with a [facility] provider today. Just as with appointments in the office, your consent must be obtained to participate. Your consent will be active for this visit and any virtual visit you may have with one of our providers in the next 365 days. If you have a MyChart account, a copy of this consent can be sent to you electronically.  As this is a virtual visit, video technology does not allow for your provider to perform a traditional examination. This may limit your provider's ability to fully assess your condition. If your provider identifies any concerns that need to be evaluated in person or the need to arrange testing (such as labs, EKG, etc.), we will make arrangements to do so. Although advances in technology are sophisticated, we cannot ensure that it will always work on either your end or our end. If the connection with a video visit is poor, the visit may have to be switched to a telephone visit. With either a video or telephone visit, we are not always able to ensure that we have a secure connection.  By engaging in this virtual visit, you consent to the provision of healthcare and authorize for your insurance to be billed (if applicable) for the services provided during this visit. Depending on your insurance coverage, you may receive a charge related to this service.  I need to obtain your verbal consent now. Are you willing to proceed with your visit today? Amy Odonnell has provided verbal consent on 07/24/2022 for a virtual visit (video or telephone). Mar Daring, PA-C  Date: 07/24/2022 1:15 PM  Virtual Visit via Video Note   I, Mar Daring, connected with  Amy Odonnell  ([id number], 11-06-1970) on 07/24/22 at  1:15 PM EST by a video-enabled telemedicine application and verified that I am speaking with the correct person using two identifiers.  Location: Patient: Virtual Visit Location Patient:  Home Provider: Virtual Visit Location Provider: Home Office   I discussed the limitations of evaluation and management by telemedicine and the availability of in person appointments. The patient expressed understanding and agreed to proceed.    History of Present Illness: Amy Odonnell is a 52 y.o. who identifies as a female who was assigned female at birth, and is being seen today for URI symptoms.  HPI: URI  This is a new problem. The current episode started in the past 7 days. The problem has been gradually worsening. There has been no fever. Associated symptoms include chest pain (tightness), congestion, coughing (productive of brown sputum), headaches, rhinorrhea (and post nasal drainage) and wheezing. Pertinent negatives include no diarrhea, ear pain, nausea, plugged ear sensation, sinus pain or sore throat. She has tried acetaminophen and decongestant for the symptoms. The treatment provided no relief.     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    Allergies:  Allergies  Allergen Reactions   Codeine Hives   Medications:  Current Outpatient Medications:    albuterol (VENTOLIN HFA) 108 (90 Base) MCG/ACT inhaler, Inhale 1-2 puffs into the lungs every 6 (six) hours as needed., Disp: 8 g, Rfl: 0   amoxicillin-clavulanate (AUGMENTIN) 875-125 MG tablet, Take 1 tablet by mouth 2 (two) times daily., Disp: 14 tablet, Rfl: 0  predniSONE (DELTASONE) 20 MG tablet, Take 2 tablets (40 mg total) by mouth daily with breakfast., Disp: 10 tablet, Rfl: 0   Spacer/Aero-Holding Chambers DEVI, Use with inhaler as needed, Disp: 1 each, Rfl: 0   fluticasone (FLONASE) 50 MCG/ACT nasal spray, Place 1 spray into both nostrils daily as needed for allergies or rhinitis., Disp: , Rfl:    loratadine (CLARITIN) 10 MG tablet,  Take 10 mg by mouth daily., Disp: , Rfl:    norgestimate-ethinyl estradiol (ORTHO-CYCLEN) 0.25-35 MG-MCG tablet, TAKE 1 TABLET BY MOUTH EVERY DAY, Disp: 56 tablet, Rfl: 1   valACYclovir (VALTREX) 500 MG tablet, Take 1 tablet (500 mg total) by mouth 2 (two) times daily. (Patient not taking: Reported on 08/22/2021), Disp: 60 tablet, Rfl: 5  Observations/Objective: Patient is well-developed, well-nourished in no acute distress.  Resting comfortably at home.  Head is normocephalic, atraumatic.  No labored breathing.  Speech is clear and coherent with logical content.  Patient is alert and oriented at baseline.  Deep bronchial cough heard x 1  Assessment and Plan: 1. Acute bacterial bronchitis - albuterol (VENTOLIN HFA) 108 (90 Base) MCG/ACT inhaler; Inhale 1-2 puffs into the lungs every 6 (six) hours as needed.  Dispense: 8 g; Refill: 0 - Spacer/Aero-Holding Dorise Bullion; Use with inhaler as needed  Dispense: 1 each; Refill: 0 - amoxicillin-clavulanate (AUGMENTIN) 875-125 MG tablet; Take 1 tablet by mouth 2 (two) times daily.  Dispense: 14 tablet; Refill: 0 - predniSONE (DELTASONE) 20 MG tablet; Take 2 tablets (40 mg total) by mouth daily with breakfast.  Dispense: 10 tablet; Refill: 0  - Worsening despite OTC medications - Will treat with Augmentin, Albuterol, Prednisone (asthma exacerbation) - Can continue Mucinex  - Push fluids.  - Rest.  - Steam and humidifier can help - Seek in person evaluation if worsening or symptoms fail to improve    Follow Up Instructions: I discussed the assessment and treatment plan with the patient. The patient was provided an opportunity to ask questions and all were answered. The patient agreed with the plan and demonstrated an understanding of the instructions.  A copy of instructions were sent to the patient via MyChart unless otherwise noted below.    The patient was advised to call back or seek an in-person evaluation if the symptoms worsen or if the  condition fails to improve as anticipated.  Time:  I spent 8 minutes with the patient via telehealth technology discussing the above problems/concerns.    Mar Daring, PA-C

## 2022-07-24 NOTE — Patient Instructions (Signed)
Amy Odonnell, thank you for joining Mar Daring, PA-C for today's virtual visit.  While this provider is not your primary care provider (PCP), if your PCP is located in our provider database this encounter information will be shared with them immediately following your visit.   [facility] account gives you access to today's visit and all your visits, tests, and labs performed at [facility] " click here if you don't have a [facility] account or go to [email or web address]  Consent: (Patient) Amy Odonnell provided verbal consent for this virtual visit at the beginning of the encounter.  Current Medications:  Current Outpatient Medications:    albuterol (VENTOLIN HFA) 108 (90 Base) MCG/ACT inhaler, Inhale 1-2 puffs into the lungs every 6 (six) hours as needed., Disp: 8 g, Rfl: 0   amoxicillin-clavulanate (AUGMENTIN) 875-125 MG tablet, Take 1 tablet by mouth 2 (two) times daily., Disp: 14 tablet, Rfl: 0   predniSONE (DELTASONE) 20 MG tablet, Take 2 tablets (40 mg total) by mouth daily with breakfast., Disp: 10 tablet, Rfl: 0   Spacer/Aero-Holding Chambers DEVI, Use with inhaler as needed, Disp: 1 each, Rfl: 0   fluticasone (FLONASE) 50 MCG/ACT nasal spray, Place 1 spray into both nostrils daily as needed for allergies or rhinitis., Disp: , Rfl:    loratadine (CLARITIN) 10 MG tablet, Take 10 mg by mouth daily., Disp: , Rfl:    norgestimate-ethinyl estradiol (ORTHO-CYCLEN) 0.25-35 MG-MCG tablet, TAKE 1 TABLET BY MOUTH EVERY DAY, Disp: 56 tablet, Rfl: 1   valACYclovir (VALTREX) 500 MG tablet, Take 1 tablet (500 mg total) by mouth 2 (two) times daily. (Patient not taking: Reported on 08/22/2021), Disp: 60 tablet, Rfl: 5   Medications ordered in this encounter:  Meds ordered this encounter  Medications   albuterol (VENTOLIN HFA) 108 (90 Base) MCG/ACT inhaler    Sig: Inhale 1-2 puffs into the lungs every 6 (six) hours as needed.    Dispense:  8 g    Refill:   0    Order Specific Question:   Supervising Provider    Answer:   Chase Picket [id number]   Spacer/Aero-Holding Dorise Bullion    Sig: Use with inhaler as needed    Dispense:  1 each    Refill:  0    Order Specific Question:   Supervising Provider    Answer:   Chase Picket [id number]   amoxicillin-clavulanate (AUGMENTIN) 875-125 MG tablet    Sig: Take 1 tablet by mouth 2 (two) times daily.    Dispense:  14 tablet    Refill:  0    Order Specific Question:   Supervising Provider    Answer:   Chase Picket [id number]   predniSONE (DELTASONE) 20 MG tablet    Sig: Take 2 tablets (40 mg total) by mouth daily with breakfast.    Dispense:  10 tablet    Refill:  0    Order Specific Question:   Supervising Provider    Answer:   Chase Picket [id number]     *If you need refills on other medications prior to your next appointment, please contact your pharmacy*  Follow-Up: Call back or seek an in-person evaluation if the symptoms worsen or if the condition fails to improve as anticipated.  [facility] [phone]  Other Instructions  Acute Bronchitis, Adult  Acute bronchitis is sudden inflammation of the main airways (bronchi) that come off the windpipe (trachea) in the lungs. The swelling causes the airways  to get smaller and make more mucus than normal. This can make it hard to breathe and can cause coughing or noisy breathing (wheezing). Acute bronchitis may last several weeks. The cough may last longer. Allergies, asthma, and exposure to smoke may make the condition worse. What are the causes? This condition can be caused by germs and by substances that irritate the lungs, including: Cold and flu viruses. The most common cause of this condition is the virus that causes the common cold. Bacteria. This is less common. Breathing in substances that irritate the lungs, including: Smoke from cigarettes and other forms of tobacco. Dust and pollen. Fumes from  household cleaning products, gases, or burned fuel. Indoor or outdoor air pollution. What increases the risk? The following factors may make you more likely to develop this condition: A weak body's defense system, also called the immune system. A condition that affects your lungs and breathing, such as asthma. What are the signs or symptoms? Common symptoms of this condition include: Coughing. This may bring up clear, yellow, or green mucus from your lungs (sputum). Wheezing. Runny or stuffy nose. Having too much mucus in your lungs (chest congestion). Shortness of breath. Aches and pains, including sore throat or chest. How is this diagnosed? This condition is usually diagnosed based on: Your symptoms and medical history. A physical exam. You may also have other tests, including tests to rule out other conditions, such as pneumonia. These tests include: A test of lung function. Test of a mucus sample to look for the presence of bacteria. Tests to check the oxygen level in your blood. Blood tests. Chest X-ray. How is this treated? Most cases of acute bronchitis clear up over time without treatment. Your health care provider may recommend: Drinking more fluids to help thin your mucus so it is easier to cough up. Taking inhaled medicine (inhaler) to improve air flow in and out of your lungs. Using a vaporizer or a humidifier. These are machines that add water to the air to help you breathe better. Taking a medicine that thins mucus and clears congestion (expectorant). Taking a medicine that prevents or stops coughing (cough suppressant). It is not common to take an antibiotic medicine for this condition. Follow these instructions at home:  Take over-the-counter and prescription medicines only as told by your health care provider. Use an inhaler, vaporizer, or humidifier as told by your health care provider. Take two teaspoons (10 mL) of honey at bedtime to lessen coughing at  night. Drink enough fluid to keep your urine pale yellow. Do not use any products that contain nicotine or tobacco. These products include cigarettes, chewing tobacco, and vaping devices, such as e-cigarettes. If you need help quitting, ask your health care provider. Get plenty of rest. Return to your normal activities as told by your health care provider. Ask your health care provider what activities are safe for you. Keep all follow-up visits. This is important. How is this prevented? To lower your risk of getting this condition again: Wash your hands often with soap and water for at least 20 seconds. If soap and water are not available, use hand sanitizer. Avoid contact with people who have cold symptoms. Try not to touch your mouth, nose, or eyes with your hands. Avoid breathing in smoke or chemical fumes. Breathing smoke or chemical fumes will make your condition worse. Get the flu shot every year. Contact a health care provider if: Your symptoms do not improve after 2 weeks. You have trouble coughing  up the mucus. Your cough keeps you awake at night. You have a fever. Get help right away if you: Cough up blood. Feel pain in your chest. Have severe shortness of breath. Faint or keep feeling like you are going to faint. Have a severe headache. Have a fever or chills that get worse. These symptoms may represent a serious problem that is an emergency. Do not wait to see if the symptoms will go away. Get medical help right away. Call your local emergency services (911 in the [place] Do not drive yourself to the hospital. Summary Acute bronchitis is inflammation of the main airways (bronchi) that come off the windpipe (trachea) in the lungs. The swelling causes the airways to get smaller and make more mucus than normal. Drinking more fluids can help thin your mucus so it is easier to cough up. Take over-the-counter and prescription medicines only as told by your health care provider. Do  not use any products that contain nicotine or tobacco. These products include cigarettes, chewing tobacco, and vaping devices, such as e-cigarettes. If you need help quitting, ask your health care provider. Contact a health care provider if your symptoms do not improve after 2 weeks. This information is not intended to replace advice given to you by your health care provider. Make sure you discuss any questions you have with your health care provider. Document Revised: 08/18/2021 Document Reviewed: 09/08/2020 Elsevier Patient Education  [place].    If you have been instructed to have an in-person evaluation today at a local Urgent Care facility, please use the link below. It will take you to a list of all of our available [facility], including address, phone number and hours of operation. Please do not delay care.  [facility]  If you or a family member do not have a primary care provider, use the link below to schedule a visit and establish care. When you choose a [facility] primary care physician or advanced practice provider, you gain a long-term partner in health. Find a Primary Care Provider  Learn more about [facility]'s in-office and virtual care options: [facility]

## 2022-07-30 ENCOUNTER — Telehealth: Payer: 59 | Admitting: Family

## 2022-07-30 DIAGNOSIS — J208 Acute bronchitis due to other specified organisms: Secondary | ICD-10-CM

## 2022-07-30 DIAGNOSIS — B9689 Other specified bacterial agents as the cause of diseases classified elsewhere: Secondary | ICD-10-CM | POA: Diagnosis not present

## 2022-07-30 MED ORDER — BENZONATATE 100 MG PO CAPS: 100.0000 mg | ORAL_CAPSULE | Freq: Three times a day (TID) | ORAL | 0 refills | Status: DC | PRN

## 2022-07-30 MED ORDER — PREDNISONE 10 MG (21) PO TBPK: ORAL_TABLET | ORAL | 0 refills | Status: DC

## 2022-07-30 MED ORDER — AZITHROMYCIN 250 MG PO TABS: ORAL_TABLET | ORAL | 0 refills | Status: DC

## 2022-07-30 NOTE — Progress Notes (Signed)
We are sorry that you are not feeling well.  Here is how we plan to help! ° °Based on your presentation I believe you most likely have A cough due to bacteria.  When patients have a fever and a productive cough with a change in color or increased sputum production, we are concerned about bacterial bronchitis.  If left untreated it can progress to pneumonia.  If your symptoms do not improve with your treatment plan it is important that you contact your provider.   I have prescribed Azithromyin 250 mg: two tablets now and then one tablet daily for 4 additonal days  °  °In addition you may use A non-prescription cough medication called Robitussin DAC. Take 2 teaspoons every 8 hours or Delsym: take 2 teaspoons every 12 hours., A non-prescription cough medication called Mucinex DM: take 2 tablets every 12 hours., and A prescription cough medication called Tessalon Perles 100mg. You may take 1-2 capsules every 8 hours as needed for your cough. ° °Prednisone 10 mg daily for 6 days (see taper instructions below) ° °Directions for 6 day taper: °Day 1: 2 tablets before breakfast, 1 after both lunch & dinner and 2 at bedtime °Day 2: 1 tab before breakfast, 1 after both lunch & dinner and 2 at bedtime °Day 3: 1 tab at each meal & 1 at bedtime °Day 4: 1 tab at breakfast, 1 at lunch, 1 at bedtime °Day 5: 1 tab at breakfast & 1 tab at bedtime °Day 6: 1 tab at breakfast ° °From your responses in the eVisit questionnaire you describe inflammation in the upper respiratory tract which is causing a significant cough.  This is commonly called Bronchitis and has four common causes:   °Allergies °Viral Infections °Acid Reflux °Bacterial Infection °Allergies, viruses and acid reflux are treated by controlling symptoms or eliminating the cause. An example might be a cough caused by taking certain blood pressure medications. You stop the cough by changing the medication. Another example might be a cough caused by acid reflux. Controlling the  reflux helps control the cough. ° °USE OF BRONCHODILATOR ("RESCUE") INHALERS: °There is a risk from using your bronchodilator too frequently.  The risk is that over-reliance on a medication which only relaxes the muscles surrounding the breathing tubes can reduce the effectiveness of medications prescribed to reduce swelling and congestion of the tubes themselves.  Although you feel brief relief from the bronchodilator inhaler, your asthma may actually be worsening with the tubes becoming more swollen and filled with mucus.  This can delay other crucial treatments, such as oral steroid medications. If you need to use a bronchodilator inhaler daily, several times per day, you should discuss this with your provider.  There are probably better treatments that could be used to keep your asthma under control.  °   °HOME CARE °Only take medications as instructed by your medical team. °Complete the entire course of an antibiotic. °Drink plenty of fluids and get plenty of rest. °Avoid close contacts especially the very young and the elderly °Cover your mouth if you cough or cough into your sleeve. °Always remember to wash your hands °A steam or ultrasonic humidifier can help congestion.  ° °GET HELP RIGHT AWAY IF: °You develop worsening fever. °You become short of breath °You cough up blood. °Your symptoms persist after you have completed your treatment plan °MAKE SURE YOU  °Understand these instructions. °Will watch your condition. °Will get help right away if you are not doing well or get worse. °  ° °  Thank you for choosing an e-visit. ° °Your e-visit answers were reviewed by a board certified advanced clinical practitioner to complete your personal care plan. Depending upon the condition, your plan could have included both over the counter or prescription medications. ° °Please review your pharmacy choice. Make sure the pharmacy is open so you can pick up prescription now. If there is a problem, you may contact your  provider through MyChart messaging and have the prescription routed to another pharmacy.  Your safety is important to us. If you have drug allergies check your prescription carefully.  ° °For the next 24 hours you can use MyChart to ask questions about today's visit, request a non-urgent call back, or ask for a work or school excuse. °You will get an email in the next two days asking about your experience. I hope that your e-visit has been valuable and will speed your recovery. ° °Approximately 5 minutes was spent documenting and reviewing patient's chart.  ° ° °

## 2022-10-20 ENCOUNTER — Other Ambulatory Visit: Payer: Self-pay | Admitting: Family Medicine

## 2022-10-20 DIAGNOSIS — N924 Excessive bleeding in the premenopausal period: Secondary | ICD-10-CM

## 2022-10-20 DIAGNOSIS — Z30011 Encounter for initial prescription of contraceptive pills: Secondary | ICD-10-CM

## 2022-11-10 ENCOUNTER — Other Ambulatory Visit: Payer: Self-pay | Admitting: Nurse Practitioner

## 2022-11-10 DIAGNOSIS — L720 Epidermal cyst: Secondary | ICD-10-CM

## 2022-11-10 MED ORDER — CEPHALEXIN 500 MG PO CAPS: 500.0000 mg | ORAL_CAPSULE | Freq: Four times a day (QID) | ORAL | 0 refills | Status: AC

## 2022-11-13 ENCOUNTER — Ambulatory Visit
Admission: RE | Admit: 2022-11-13 | Discharge: 2022-11-13 | Disposition: A | Discharge status: Home / Self Care | Payer: 59 | Source: Ambulatory Visit | Attending: Nurse Practitioner | Admitting: Nurse Practitioner

## 2022-11-13 VITALS — BP 131/83 | HR 73 | Temp 98.0°F | Resp 18

## 2022-11-13 DIAGNOSIS — L02411 Cutaneous abscess of right axilla: Secondary | ICD-10-CM

## 2022-11-13 MED ORDER — SULFAMETHOXAZOLE-TRIMETHOPRIM 800-160 MG PO TABS: 1.0000 | ORAL_TABLET | Freq: Two times a day (BID) | ORAL | 0 refills | Status: AC

## 2022-11-13 NOTE — ED Provider Notes (Signed)
[facility]    CSN: [id number] Arrival date & time: 11/13/22  1656      History   Chief Complaint Chief Complaint  Patient presents with   Abscess    Entered by patient    HPI Amy Odonnell is a 52 y.o. female presents for evaluation of an abscess.  Patient reports history of a cyst to her right axilla that over the past 4 days has become red, warm, swollen and painful.  She was able to start Keflex 4 times a day but states it seems to be worsening.  No active drainage, fevers or chills.  No history of MRSA.  She is been taking ibuprofen OTC.  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      Home Medications    Prior to Admission medications   Medication Sig Start Date End Date Taking? Authorizing Provider  cephALEXin (KEFLEX) 500 MG capsule Take 1 capsule (500 mg total) by mouth 4 (four) times daily for 10 days. 11/10/22 11/20/22 Yes Viviano Simas, FNP  norgestimate-ethinyl estradiol (ORTHO-CYCLEN) 0.25-35 MG-MCG tablet TAKE 1 TABLET BY MOUTH EVERY DAY 10/20/22  Yes Karie Georges, MD  sulfamethoxazole-trimethoprim (BACTRIM DS) 800-160 MG tablet Take 1 tablet by mouth 2 (two) times daily for 10 days. 11/13/22 11/23/22 Yes Radford Pax, NP  albuterol (VENTOLIN HFA) 108 (90 Base) MCG/ACT inhaler  Inhale 1-2 puffs into the lungs every 6 (six) hours as needed. 07/24/22   Margaretann Loveless, PA-C  benzonatate (TESSALON PERLES) 100 MG capsule Take 1 capsule (100 mg total) by mouth 3 (three) times daily as needed. 07/30/22   Junie Spencer, FNP  fluticasone (FLONASE) 50 MCG/ACT nasal spray Place 1 spray into both nostrils daily as needed for allergies or rhinitis.    [provider]  loratadine (CLARITIN) 10 MG tablet Take 10 mg by mouth daily.    [provider]  predniSONE (STERAPRED UNI-PAK 21 TAB) 10 MG (21) TBPK tablet Use as directed 07/30/22   Junie Spencer, FNP  Spacer/Aero-Holding Rudean Curt Use with inhaler as needed 07/24/22   Margaretann Loveless, PA-C  valACYclovir (VALTREX) 500 MG tablet Take 1 tablet (500 mg total) by mouth 2 (two) times daily. Patient not taking: Reported on 08/22/2021 05/27/20   Wynn Banker, MD    Family History Family History  Problem Relation Age of Onset   Asthma Mother    Hypothyroidism Mother    Obesity Mother    Depression Mother    Arthritis Mother    Hyperlipidemia Father    Other Father        hyperglycemia   Depression Sister    Diabetes Maternal Grandmother  Hypertension Maternal Grandmother    Colon cancer Neg Hx    Colon polyps Neg Hx    Crohn's disease Neg Hx    Esophageal cancer Neg Hx    Rectal cancer Neg Hx    Stomach cancer Neg Hx    Breast cancer Neg Hx     Social History Social History   Tobacco Use   Smoking status: Never    Passive exposure: Never   Smokeless tobacco: Never  Vaping Use   Vaping Use: Never used  Substance Use Topics   Alcohol use: Not Currently   Drug use: No     Allergies   Codeine   Review of Systems Review of Systems  Skin:        Abscess to right axilla     Physical Exam Triage Vital Signs ED Triage Vitals  Enc Vitals Group     BP 11/13/22 1716 131/83     Pulse Rate 11/13/22 1716 73     Resp 11/13/22 1716 18     Temp 11/13/22 1716 98 F (36.7 C)      Temp Source 11/13/22 1716 Oral     SpO2 11/13/22 1716 98 %     Weight --      Height --      Head Circumference --      Peak Flow --      Pain Score 11/13/22 1710 5     Pain Loc --      Pain Edu? --      Excl. in GC? --    No data found.  Updated Vital Signs BP 131/83 (BP Location: Right Arm)   Pulse 73   Temp 98 F (36.7 C) (Oral)   Resp 18   LMP 11/10/2022 (Approximate)   SpO2 98%   Visual Acuity Right Eye Distance:   Left Eye Distance:   Bilateral Distance:    Right Eye Near:   Left Eye Near:    Bilateral Near:     Physical Exam Vitals and nursing note reviewed.  Constitutional:      Appearance: Normal appearance.  HENT:     Head: Normocephalic and atraumatic.  Eyes:     Pupils: Pupils are equal, round, and reactive to light.  Cardiovascular:     Rate and Rhythm: Normal rate.  Pulmonary:     Effort: Pulmonary effort is normal.  Skin:    General: Skin is warm and dry.     Comments: 2 x 1 and half indurated slightly fluctuant abscess to the right axilla.  Erythema slight warmth extending to mid upper arm.  No streaking.  Neurological:     General: No focal deficit present.     Mental Status: She is alert and oriented to person, place, and time.  Psychiatric:        Mood and Affect: Mood normal.        Behavior: Behavior normal.      UC Treatments / Results  Labs (all labs ordered are listed, but only abnormal results are displayed) Labs Reviewed - No data to display  EKG   Radiology No results found.  Procedures Incision and Drainage  Date/Time: 11/13/2022 5:41 PM  Performed by: Radford Pax, NP Authorized by: Radford Pax, NP   Consent:    Consent obtained:  Verbal   Consent given by:  Patient   Risks discussed:  Bleeding, incomplete drainage, pain, infection and damage to other organs   Alternatives discussed:  Alternative treatment Universal  protocol:    Patient identity confirmed:  Verbally with patient Location:    Type:   Abscess   Size:  2cm   Location:  Upper extremity   Upper extremity location:  Arm   Arm location:  R upper arm Pre-procedure details:    Skin preparation:  Chlorhexidine Sedation:    Sedation type:  None Anesthesia:    Anesthesia method:  Local infiltration   Local anesthetic:  Lidocaine 2% WITH epi Procedure type:    Complexity:  Simple Procedure details:    Incision types:  Stab incision   Wound management:  Probed and deloculated   Drainage:  Serosanguinous and purulent   Drainage amount:  Moderate   Wound treatment:  Wound left open   Packing materials:  1/4 in iodoform gauze Post-procedure details:    Procedure completion:  Tolerated well, no immediate complications  (including critical care time)  Medications Ordered in UC Medications - No data to display  Initial Impression / Assessment and Plan / UC Course  I have reviewed the triage vital signs and the nursing notes.  Pertinent labs & imaging results that were available during my care of the patient were reviewed by me and considered in my medical decision making (see chart for details).     Continue Keflex we will add Bactrim twice daily for 10 days.  Wound care reviewed.  Patient is going out of town but can have the wound checked in 2 days to have packing removed and repacked if needed.  Patient to go to the ER for any worsening symptoms.   Final Clinical Impressions(s) / UC Diagnoses   Final diagnoses:  Abscess of right axilla   Discharge Instructions   None    ED Prescriptions     Medication Sig Dispense Auth. Provider   sulfamethoxazole-trimethoprim (BACTRIM DS) 800-160 MG tablet Take 1 tablet by mouth 2 (two) times daily for 10 days. 20 tablet Radford Pax, NP      PDMP not reviewed this encounter.   Radford Pax, NP 11/13/22 1744

## 2022-11-13 NOTE — ED Triage Notes (Signed)
Pt reports abscess to R axilla x4 days. Has been on Keflex since Saturday; symptoms have worsened.

## 2022-11-27 ENCOUNTER — Other Ambulatory Visit: Payer: Self-pay | Admitting: Family Medicine

## 2022-11-27 DIAGNOSIS — N924 Excessive bleeding in the premenopausal period: Secondary | ICD-10-CM

## 2022-11-27 DIAGNOSIS — Z30011 Encounter for initial prescription of contraceptive pills: Secondary | ICD-10-CM

## 2022-12-18 ENCOUNTER — Encounter: Payer: Self-pay | Admitting: Gastroenterology

## 2022-12-18 ENCOUNTER — Other Ambulatory Visit: Payer: Self-pay | Admitting: Family Medicine

## 2022-12-18 ENCOUNTER — Other Ambulatory Visit (HOSPITAL_BASED_OUTPATIENT_CLINIC_OR_DEPARTMENT_OTHER): Payer: Self-pay

## 2022-12-18 ENCOUNTER — Ambulatory Visit: Payer: 59 | Admitting: Family Medicine

## 2022-12-18 ENCOUNTER — Encounter: Payer: Self-pay | Admitting: Family Medicine

## 2022-12-18 VITALS — BP 110/78 | HR 75 | Temp 98.6°F | Ht 67.5 in | Wt 188.8 lb

## 2022-12-18 DIAGNOSIS — Z0001 Encounter for general adult medical examination with abnormal findings: Secondary | ICD-10-CM | POA: Diagnosis not present

## 2022-12-18 DIAGNOSIS — N951 Menopausal and female climacteric states: Secondary | ICD-10-CM | POA: Diagnosis not present

## 2022-12-18 DIAGNOSIS — Z23 Encounter for immunization: Secondary | ICD-10-CM | POA: Diagnosis not present

## 2022-12-18 DIAGNOSIS — Z0000 Encounter for general adult medical examination without abnormal findings: Secondary | ICD-10-CM

## 2022-12-18 DIAGNOSIS — Z1231 Encounter for screening mammogram for malignant neoplasm of breast: Secondary | ICD-10-CM

## 2022-12-18 DIAGNOSIS — Z13220 Encounter for screening for lipoid disorders: Secondary | ICD-10-CM

## 2022-12-18 MED ORDER — NORETHINDRONE ACET-ETHINYL EST 1.5-30 MG-MCG PO TABS
1.0000 | ORAL_TABLET | Freq: Every day | ORAL | 3 refills | Status: DC
  Filled 2022-12-18: qty 63, 63d supply, fill #0
  Filled 2023-03-21: qty 63, 63d supply, fill #1
  Filled 2023-07-09: qty 63, 63d supply, fill #2
  Filled 2023-09-11: qty 63, 63d supply, fill #3

## 2022-12-18 NOTE — Patient Instructions (Signed)

## 2022-12-18 NOTE — Progress Notes (Unsigned)
Complete physical exam  Patient: Amy Odonnell   DOB: Nov 13, 1970   52 y.o. Female  MRN: [id number]  Subjective:    Chief Complaint  Patient presents with  . Annual Exam    Amy Odonnell is a 52 y.o. female who presents today for a complete physical exam. She reports consuming a  vegetarian  diet.  Patient stays active daily, is working on getting more exercise  She generally feels well. She reports sleeping poorly due to hot flashes. She does have additional problems to discuss today.   Patient is experiencing severely heavy periods, has stopped taking her birth control in June, has not had any periods since then. Is having sleep problems and hot flashes. Is waking up in the middle of the night, is having a tough time to fall asleep.   Most recent fall risk assessment:    01/01/2019    2:35 PM  Fall Risk   Falls in the past year? 0  Number falls in past yr: 0  Injury with Fall? 0     Most recent depression screenings:    12/18/2022    1:01 PM 08/17/2021    1:05 PM  PHQ 2/9 Scores  PHQ - 2 Score 0 0  PHQ- 9 Score 0 1    Vision:Within last year and Dental: No current dental problems and Receives regular dental care  Patient Active Problem List   Diagnosis Date Noted  . Asthma 01/04/2022  . Seasonal allergies 01/04/2022  . Migraine 01/04/2022  . Mass in rectum 10/24/2021  . Palpitations 01/31/2019  . 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 albuterol (VENTOLIN HFA) 108 (90 Base) MCG/ACT inhaler Inhale 1-2 puffs into the lungs every 6 (six) hours as needed.  . fluticasone (FLONASE) 50 MCG/ACT nasal spray Place 1 spray into both nostrils daily as needed for allergies or rhinitis.  Marland Kitchen loratadine (CLARITIN) 10 MG  tablet Take 10 mg by mouth daily.  Marland Kitchen Spacer/Aero-Holding Rudean Curt Use with inhaler as needed  . valACYclovir (VALTREX) 500 MG tablet Take 1 tablet (500 mg total) by mouth 2 (two) times daily. (Patient not taking: Reported on 08/22/2021)  . [DISCONTINUED] benzonatate (TESSALON PERLES) 100 MG capsule Take 1 capsule (100 mg total) by mouth 3 (three) times daily as needed.  . [DISCONTINUED] norgestimate-ethinyl estradiol (MILI) 0.25-35 MG-MCG tablet TAKE 1 TABLET BY MOUTH EVERY DAY  . [DISCONTINUED] predniSONE (STERAPRED UNI-PAK 21 TAB) 10 MG (21) TBPK tablet Use as directed   No facility-administered medications prior to visit.    Review of Systems  HENT:  Negative for hearing loss.   Eyes:  Negative for blurred vision.  Respiratory:  Negative for shortness of breath.   Cardiovascular:  Negative for chest pain.  Gastrointestinal: Negative.   Genitourinary: Negative.   Musculoskeletal:  Negative for back pain.  Neurological:  Negative for headaches.  Psychiatric/Behavioral:  Negative for depression.   All other systems reviewed and are negative.      Objective:     BP 110/78 (BP Location: Left Arm, Patient Position: Sitting, Cuff Size: Large)   Pulse 75   Temp 98.6 F (37 C) (Oral)   Ht 5' 7.5" (1.715 m)   Wt 188 lb 12.8 oz (85.6 kg)   LMP  10/21/2022 (Exact Date)   SpO2 98%   BMI 29.13 kg/m  {Vitals History (Optional):23777}  Physical Exam Vitals reviewed.  Constitutional:      Appearance: Normal appearance. She is well-groomed and normal weight.  HENT:     Right Ear: Tympanic membrane and ear canal normal.     Left Ear: Tympanic membrane and ear canal normal.     Mouth/Throat:     Mouth: Mucous membranes are moist.     Pharynx: No posterior oropharyngeal erythema.  Eyes:     Conjunctiva/sclera: Conjunctivae normal.  Neck:     Thyroid: No thyromegaly.  Cardiovascular:     Rate and Rhythm: Normal rate and regular rhythm.     Pulses: Normal pulses.     Heart  sounds: S1 normal and S2 normal.  Pulmonary:     Effort: Pulmonary effort is normal.     Breath sounds: Normal breath sounds and air entry.  Abdominal:     General: Abdomen is flat. Bowel sounds are normal.     Palpations: Abdomen is soft.  Musculoskeletal:     Right lower leg: No edema.     Left lower leg: No edema.  Lymphadenopathy:     Cervical: No cervical adenopathy.  Neurological:     Mental Status: She is alert and oriented to person, place, and time. Mental status is at baseline.     Gait: Gait is intact.  Psychiatric:        Mood and Affect: Mood and affect normal.        Speech: Speech normal.        Behavior: Behavior normal.        Judgment: Judgment normal.     No results found for any visits on 12/18/22. {Show previous labs (optional):23779}    Assessment & Plan:    Routine Health Maintenance and Physical Exam  Immunization History  Administered Date(s) Administered  . Influenza,inj,Quad PF,6+ Mos 01/31/2017  . Influenza-Unspecified 03/05/2018, 02/13/2020, 02/19/2021, 03/11/2022  . Moderna Covid-19 Vaccine Bivalent Booster 62yrs & up 05/22/2020, 07/19/2020, 02/16/2021  . Moderna SARS-COV2 Booster Vaccination 04/13/2020  . Tdap 07/12/2012  . Zoster Recombinant(Shingrix) 08/17/2021, 06/13/2022    Health Maintenance  Topic Date Due  . DTaP/Tdap/Td (2 - Td or Tdap) 07/12/2022  . Colonoscopy  09/06/2022  . COVID-19 Vaccine (4 - 2023-24 season) 01/03/2023 (Originally 01/20/2022)  . INFLUENZA VACCINE  12/21/2022  . MAMMOGRAM  09/28/2023  . PAP SMEAR-Modifier  08/18/2026  . Hepatitis C Screening  Completed  . HIV Screening  Completed  . Zoster Vaccines- Shingrix  Completed  . HPV VACCINES  Aged Out    Discussed health benefits of physical activity, and encouraged her to engage in regular exercise appropriate for her age and condition.  Routine general medical examination at a health care facility -     Comprehensive metabolic panel; Future -     CBC with  Differential/Platelet; Future  Lipid screening -     Lipid panel; Future  Immunization due -     Tdap vaccine greater than or equal to 7yo IM  Menopausal flushing -     Norethindrone Acet-Ethinyl Est; Take 1 tablet by mouth daily.  Dispense: 84 tablet; Refill: 3    Return in 1 year (on 12/18/2023).     Karie Georges, MD

## 2022-12-20 ENCOUNTER — Ambulatory Visit: Admission: RE | Admit: 2022-12-20 | Payer: 59 | Source: Ambulatory Visit

## 2022-12-20 DIAGNOSIS — N951 Menopausal and female climacteric states: Secondary | ICD-10-CM | POA: Insufficient documentation

## 2022-12-20 DIAGNOSIS — Z1231 Encounter for screening mammogram for malignant neoplasm of breast: Secondary | ICD-10-CM | POA: Diagnosis not present

## 2023-01-08 DIAGNOSIS — H5213 Myopia, bilateral: Secondary | ICD-10-CM | POA: Diagnosis not present

## 2023-02-13 ENCOUNTER — Other Ambulatory Visit (HOSPITAL_BASED_OUTPATIENT_CLINIC_OR_DEPARTMENT_OTHER): Payer: Self-pay

## 2023-02-13 ENCOUNTER — Encounter: Payer: Self-pay | Admitting: Gastroenterology

## 2023-02-13 ENCOUNTER — Ambulatory Visit (AMBULATORY_SURGERY_CENTER): Payer: 59 | Admitting: *Deleted

## 2023-02-13 VITALS — Ht 67.0 in | Wt 185.0 lb

## 2023-02-13 DIAGNOSIS — Z86010 Personal history of colonic polyps: Secondary | ICD-10-CM

## 2023-02-13 MED ORDER — NA SULFATE-K SULFATE-MG SULF 17.5-3.13-1.6 GM/177ML PO SOLN
1.0000 | Freq: Once | ORAL | 0 refills | Status: AC
  Filled 2023-02-13: qty 354, 1d supply, fill #0

## 2023-02-13 MED ORDER — ONDANSETRON HCL 4 MG PO TABS
4.0000 mg | ORAL_TABLET | ORAL | 0 refills | Status: DC
  Filled 2023-02-13: qty 2, 1d supply, fill #0

## 2023-02-13 NOTE — Progress Notes (Signed)
No egg or soy allergy known to patient  No issues known to pt with past sedation with any surgeries or procedures Patient denies ever being told they had issues or difficulty with intubation  No FH of Malignant Hyperthermia Pt is not on diet pills Pt is not on  home 02  Pt is not on blood thinners  Pt denies issues with constipation  No A fib or A flutter Have any cardiac testing pending--no Pt instructed to use Singlecare.com or GoodRx for a price reduction on prep    Patient's chart reviewed by Cathlyn Parsons CNRA prior to previsit and patient appropriate for the [facility].  Previsit completed and red dot placed by patient's name on their procedure day (on provider's schedule).     Pt.concerned about nausea and vomiting stated she vomited with last prep,zofran 4mg  sent in with prep,instructed pt to take 30-60 minutes prior to each prep dose.

## 2023-03-06 ENCOUNTER — Encounter: Payer: Self-pay | Admitting: Gastroenterology

## 2023-03-06 ENCOUNTER — Ambulatory Visit: Payer: 59 | Admitting: Gastroenterology

## 2023-03-06 VITALS — BP 109/67 | HR 78 | Temp 98.6°F | Resp 13 | Ht 67.5 in | Wt 185.0 lb

## 2023-03-06 DIAGNOSIS — Z09 Encounter for follow-up examination after completed treatment for conditions other than malignant neoplasm: Secondary | ICD-10-CM

## 2023-03-06 DIAGNOSIS — Z860101 Personal history of adenomatous and serrated colon polyps: Secondary | ICD-10-CM

## 2023-03-06 DIAGNOSIS — D122 Benign neoplasm of ascending colon: Secondary | ICD-10-CM

## 2023-03-06 DIAGNOSIS — Z1211 Encounter for screening for malignant neoplasm of colon: Secondary | ICD-10-CM | POA: Diagnosis not present

## 2023-03-06 DIAGNOSIS — Z860100 Personal history of colon polyps, unspecified: Secondary | ICD-10-CM

## 2023-03-06 MED ORDER — SODIUM CHLORIDE 0.9 % IV SOLN: 500.0000 mL | Freq: Once | INTRAVENOUS | Status: DC

## 2023-03-06 NOTE — Progress Notes (Signed)
[facility] History and Physical   Primary Care Physician:  Karie Georges, MD   Reason for Procedure:   History of advanced colon polyp  Plan:    Colonoscopy     HPI: Amy Odonnell is a 52 y.o. 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  She has no family history of colon cancer and no chronic GI symptoms.    Past Medical History:  Diagnosis Date   Allergy    SEASONAL   Asthma    Migraine    Pneumonia    Seasonal allergies     Past Surgical History:  Procedure Laterality Date   APPENDECTOMY     COLONOSCOPY     LAPAROSCOPIC PARTIAL PROTECTOMY N/A 01/04/2022   Procedure: ROBOTIC TAMIS PARTIAL PROTECTOMY OF RECTAL MASS;  Surgeon: Karie Soda, MD;  Location: [facility];  Service: General;  Laterality: N/A;    Prior to Admission medications   Medication Sig Start Date End Date Taking? Authorizing Provider  loratadine (CLARITIN) 10 MG tablet Take 10 mg by mouth as needed.   Yes [provider]  Norethindrone Acetate-Ethinyl Estradiol (LOESTRIN) 1.5-30 MG-MCG tablet Take 1 tablet by mouth daily. To be taken continuously. 12/18/22  Yes Karie Georges, MD  ondansetron (ZOFRAN) 4 MG tablet Take 1 tablet (4 mg total) by mouth 30-60 minutes prior to each prep dose. 02/13/23  Yes Jenel Lucks, MD  albuterol (VENTOLIN HFA) 108 (90 Base) MCG/ACT inhaler Inhale 1-2 puffs into the lungs every 6 (six) hours as needed. 07/24/22   Margaretann Loveless, PA-C  fluticasone (FLONASE) 50 MCG/ACT nasal spray Place 1 spray into both nostrils daily as needed for allergies or rhinitis.    [provider]  Spacer/Aero-Holding Rudean Curt Use with inhaler as needed 07/24/22   Margaretann Loveless, PA-C  valACYclovir (VALTREX) 500 MG tablet Take 1 tablet (500 mg total) by mouth 2 (two) times daily. Patient taking differently: Take  500 mg by mouth as needed. 05/27/20   Wynn Banker, MD    Current Outpatient Medications  Medication Sig Dispense Refill   loratadine (CLARITIN) 10 MG tablet Take 10 mg by mouth as needed.     Norethindrone Acetate-Ethinyl Estradiol (LOESTRIN) 1.5-30 MG-MCG tablet Take 1 tablet by mouth daily. To be taken continuously. 84 tablet 3   ondansetron (ZOFRAN) 4 MG tablet Take 1 tablet (4 mg total) by mouth 30-60 minutes prior to each prep dose. 2 tablet 0   albuterol (VENTOLIN HFA) 108 (90 Base) MCG/ACT inhaler Inhale 1-2 puffs into the lungs every 6 (six) hours as needed. 8 g 0   fluticasone (FLONASE) 50 MCG/ACT nasal spray Place 1 spray into both nostrils daily as needed for allergies or rhinitis.     Spacer/Aero-Holding Rudean Curt Use with inhaler as needed 1 each 0   valACYclovir (VALTREX) 500 MG tablet Take 1 tablet (500 mg total) by mouth 2 (two) times daily. (Patient taking differently: Take 500 mg by mouth as needed.) 60 tablet 5   Current Facility-Administered Medications  Medication Dose Route Frequency Provider Last Rate Last Admin   0.9 %  sodium chloride infusion  500 mL Intravenous Once Jenel Lucks, MD        Allergies as of 03/06/2023 - Review Complete 03/06/2023  Allergen Reaction Noted   Codeine Hives 04/14/2011    Family History  Problem Relation Age of Onset   Asthma Mother  Hypothyroidism Mother    Obesity Mother    Depression Mother    Arthritis Mother    Hyperlipidemia Father    Other Father        hyperglycemia   Depression Sister    Diabetes Maternal Grandmother    Hypertension Maternal Grandmother    Colon cancer Neg Hx    Colon polyps Neg Hx    Crohn's disease Neg Hx    Esophageal cancer Neg Hx    Rectal cancer Neg Hx    Stomach cancer Neg Hx    Breast cancer Neg Hx    Ulcerative colitis Neg Hx     Social History   Socioeconomic History   Marital status: Single    Spouse name: Not on file   Number of children: Not on file    Years of education: Not on file   Highest education level: Not on file  Occupational History   Occupation: nurse practitioner  Tobacco Use   Smoking status: Never    Passive exposure: Never   Smokeless tobacco: Never  Vaping Use   Vaping status: Never Used  Substance and Sexual Activity   Alcohol use: Not Currently   Drug use: No   Sexual activity: Yes    Birth control/protection: Pill  Other Topics Concern   Not on file  Social History Narrative   Finished PhD in Nursing in 09/2016.    Nurse Practitioner   Single, 2 adopted children - born 2011 & 2015 - in [employer]. 04/02/2017    Social Determinants of Health   Financial Resource Strain: Not on file  Food Insecurity: Not on file  Transportation Needs: Not on file  Physical Activity: Not on file  Stress: Not on file  Social Connections: Not on file  Intimate Partner Violence: Not on file    Review of Systems:  All other review of systems negative except as mentioned in the HPI.  Physical Exam: Vital signs BP (!) 92/57   Pulse 69   Temp 98.6 F (37 C) (Temporal)   Ht 5' 7.5" (1.715 m)   Wt 185 lb (83.9 kg)   SpO2 99%   BMI 28.55 kg/m   General:   Alert,  Well-developed, well-nourished, pleasant and cooperative in NAD Airway:  Mallampati 2 Lungs:  Clear throughout to auscultation.   Heart:  Regular rate and rhythm; no murmurs, clicks, rubs,  or gallops. Abdomen:  Soft, nontender and nondistended. Normal bowel sounds.   Neuro/Psych:  Normal mood and affect. A and O x 3   Amy Odonnell E. Tomasa Rand, MD [facility]\

## 2023-03-06 NOTE — Op Note (Signed)
[facility] Patient Name: Amy Odonnell Procedure Date: 03/06/2023 10:18 AM MRN: [id number] Endoscopist: Lorin Picket E. 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 Medicines:                Monitored Anesthesia Care Procedure:                Pre-Anesthesia Assessment:                           - Prior to the procedure, a History and Physical                            was performed, and patient medications and                            allergies were reviewed. The patient's tolerance of                            previous anesthesia was also reviewed. The risks                            and benefits of the procedure and the sedation                            options and risks were discussed with the patient.                            All questions were answered, and informed consent                            was obtained. Prior Anticoagulants: The patient has                            taken no anticoagulant or antiplatelet agents. ASA                            Grade Assessment: II - A patient with mild systemic  disease. After reviewing the risks and benefits,                            the patient was deemed in satisfactory condition to                            undergo the procedure.                           After obtaining informed consent, the colonoscope                            was passed under direct vision. Throughout the                            procedure, the  patient's blood pressure, pulse, and                            oxygen saturations were monitored continuously. The                            CF [id number] #[id number] was introduced through the anus                            and advanced to the the terminal ileum, with                            identification of the appendiceal orifice and IC                            valve. The colonoscopy was performed without                            difficulty. The patient tolerated the procedure                            well. The quality of the bowel preparation was                            good. The terminal ileum, ileocecal valve,                            appendiceal orifice, and rectum were photographed.                            The bowel preparation used was SUPREP via split                            dose instruction. Scope In: 10:27:58 AM Scope Out: 10:46:40 AM Scope Withdrawal Time: 0 hours 11 minutes 55 seconds  Total Procedure Duration: 0 hours 18 minutes 42 seconds  Findings:                 The perianal and digital rectal examinations were  normal. Pertinent negatives include normal                            sphincter tone and no palpable rectal lesions.                           A 3 mm polyp was found in the ascending colon. The                            polyp was sessile. The polyp was removed with a                            cold snare. Resection and retrieval were complete.                            Estimated blood loss was minimal.                           A large complex scar was found in the proximal                            rectum. The scar tissue was healthy in appearance.                            There was no evidence of the previous polyp and no                            suspicious appearing mucosa.                           The exam was otherwise normal throughout the                            examined colon.                            The terminal ileum appeared normal.                           The retroflexed view of the distal rectum and anal                            verge was normal and showed no anal or rectal                            abnormalities. Complications:            No immediate complications. Estimated Blood Loss:     Estimated blood loss was minimal. Impression:               - One 3 mm polyp in the ascending colon, removed                            with a cold snare. Resected and retrieved.                           -  Scar in the proximal rectum. No evidence of                            residual/recurrent polyp.                           - The examined portion of the ileum was normal.                           - The distal rectum and anal verge are normal on                            retroflexion view. Recommendation:           - Patient has a contact number available for                            emergencies. The signs and symptoms of potential                            delayed complications were discussed with the                            patient. Return to normal activities tomorrow.                            Written discharge instructions were provided to the                            patient.                           - Resume previous diet.                           - Continue present medications.                           - Await pathology results.                           - Repeat colonoscopy in 3 years for surveillance. Amy Odonnell E. Tomasa Rand, MD 03/06/2023 10:54:46 AM This report has been signed electronically.

## 2023-03-06 NOTE — Progress Notes (Signed)
Vss nad trans to pacu 

## 2023-03-06 NOTE — Progress Notes (Signed)
Called to room to assist during endoscopic procedure.  Patient ID and intended procedure confirmed with present staff. Received instructions for my participation in the procedure from the performing physician.  

## 2023-03-06 NOTE — Patient Instructions (Signed)
Resume previous diet and medications. Awaiting pathology results. Repeat Colonoscopy in 3 years for surveillance. Handout provided on Colon polyps  YOU HAD AN ENDOSCOPIC PROCEDURE TODAY AT THE [facility]:   Refer to the procedure report that was given to you for any specific questions about what was found during the examination.  If the procedure report does not answer your questions, please call your gastroenterologist to clarify.  If you requested that your care partner not be given the details of your procedure findings, then the procedure report has been included in a sealed envelope for you to review at your convenience later.  YOU SHOULD EXPECT: Some feelings of bloating in the abdomen. Passage of more gas than usual.  Walking can help get rid of the air that was put into your GI tract during the procedure and reduce the bloating. If you had a lower endoscopy (such as a colonoscopy or flexible sigmoidoscopy) you may notice spotting of blood in your stool or on the toilet paper. If you underwent a bowel prep for your procedure, you may not have a normal bowel movement for a few days.  Please Note:  You might notice some irritation and congestion in your nose or some drainage.  This is from the oxygen used during your procedure.  There is no need for concern and it should clear up in a day or so.  SYMPTOMS TO REPORT IMMEDIATELY:  Following lower endoscopy (colonoscopy or flexible sigmoidoscopy):  Excessive amounts of blood in the stool  Significant tenderness or worsening of abdominal pains  Swelling of the abdomen that is new, acute  Fever of 100F or higher  For urgent or emergent issues, a gastroenterologist can be reached at any hour by calling [phone]. Do not use MyChart messaging for urgent concerns.    DIET:  We do recommend a small meal at first, but then you may proceed to your regular diet.  Drink plenty of fluids but you should avoid alcoholic beverages for 24  hours.  ACTIVITY:  You should plan to take it easy for the rest of today and you should NOT DRIVE or use heavy machinery until tomorrow (because of the sedation medicines used during the test).    FOLLOW UP: Our staff will call the number listed on your records the next business day following your procedure.  We will call around 7:15- 8:00 am to check on you and address any questions or concerns that you may have regarding the information given to you following your procedure. If we do not reach you, we will leave a message.     If any biopsies were taken you will be contacted by phone or by letter within the next 1-3 weeks.  Please call us at [phone] if you have not heard about the biopsies in 3 weeks.    SIGNATURES/CONFIDENTIALITY: You and/or your care partner have signed paperwork which will be entered into your electronic medical record.  These signatures attest to the fact that that the information above on your After Visit Summary has been reviewed and is understood.  Full responsibility of the confidentiality of this discharge information lies with you and/or your care-partner.

## 2023-03-07 ENCOUNTER — Telehealth: Payer: Self-pay

## 2023-03-07 NOTE — Telephone Encounter (Signed)
Follow up call to pt, lm for pt to call if having any difficulty with normal activities or eating and drinking.  Also to call if any other questions or concerns.  

## 2023-03-08 LAB — SURGICAL PATHOLOGY

## 2023-03-08 NOTE — Progress Notes (Signed)
Amy Odonnell,  The polyp which I removed during your recent procedure was proven to be completely benign but is considered a "pre-cancerous" polyp that MAY have grown into cancer if it had not been removed.  Based on your history of a large, advanced polyp requiring surgical resection, I recommend that you have a repeat colonoscopy in 3 years.   If you develop any new rectal bleeding, abdominal pain or significant bowel habit changes, please contact me before then.

## 2023-03-21 ENCOUNTER — Other Ambulatory Visit (HOSPITAL_BASED_OUTPATIENT_CLINIC_OR_DEPARTMENT_OTHER): Payer: Self-pay

## 2023-03-21 ENCOUNTER — Other Ambulatory Visit: Payer: Self-pay

## 2023-03-21 MED ORDER — INFLUENZA VIRUS VACC SPLIT PF (FLUZONE) 0.5 ML IM SUSY
0.5000 mL | PREFILLED_SYRINGE | Freq: Once | INTRAMUSCULAR | 0 refills | Status: AC
  Filled 2023-03-21: qty 0.5, 1d supply, fill #0

## 2023-04-26 ENCOUNTER — Telehealth: Payer: 59 | Admitting: Physician Assistant

## 2023-04-26 ENCOUNTER — Other Ambulatory Visit (HOSPITAL_BASED_OUTPATIENT_CLINIC_OR_DEPARTMENT_OTHER): Payer: Self-pay

## 2023-04-26 DIAGNOSIS — J069 Acute upper respiratory infection, unspecified: Secondary | ICD-10-CM

## 2023-04-26 DIAGNOSIS — R051 Acute cough: Secondary | ICD-10-CM | POA: Diagnosis not present

## 2023-04-26 DIAGNOSIS — J9801 Acute bronchospasm: Secondary | ICD-10-CM

## 2023-04-26 MED ORDER — BENZONATATE 100 MG PO CAPS
100.0000 mg | ORAL_CAPSULE | Freq: Three times a day (TID) | ORAL | 0 refills | Status: DC | PRN
  Filled 2023-04-26: qty 30, 5d supply, fill #0

## 2023-04-26 MED ORDER — METHYLPREDNISOLONE 4 MG PO TBPK
ORAL_TABLET | ORAL | 0 refills | Status: DC
  Filled 2023-04-26: qty 21, 6d supply, fill #0

## 2023-04-26 MED ORDER — FLUTICASONE PROPIONATE 50 MCG/ACT NA SUSP
2.0000 | Freq: Every day | NASAL | 1 refills | Status: DC
  Filled 2023-04-26: qty 16, 30d supply, fill #0

## 2023-04-26 NOTE — Progress Notes (Signed)

## 2023-05-01 ENCOUNTER — Telehealth: Payer: 59 | Admitting: Physician Assistant

## 2023-05-01 ENCOUNTER — Other Ambulatory Visit (HOSPITAL_BASED_OUTPATIENT_CLINIC_OR_DEPARTMENT_OTHER): Payer: Self-pay

## 2023-05-01 DIAGNOSIS — B9689 Other specified bacterial agents as the cause of diseases classified elsewhere: Secondary | ICD-10-CM

## 2023-05-01 MED ORDER — PREDNISONE 10 MG (21) PO TBPK
ORAL_TABLET | ORAL | 0 refills | Status: DC
  Filled 2023-05-01 (×2): qty 21, 6d supply, fill #0

## 2023-05-01 MED ORDER — AZITHROMYCIN 250 MG PO TABS
ORAL_TABLET | ORAL | 0 refills | Status: AC
  Filled 2023-05-01: qty 6, 5d supply, fill #0

## 2023-05-01 NOTE — Progress Notes (Signed)
I have spent 5 minutes in review of e-visit questionnaire, review and updating patient chart, medical decision making and response to patient.   Mia Milan Cody Jacklynn Dehaas, PA-C    

## 2023-05-01 NOTE — Progress Notes (Signed)
E-Visit for Cough   We are sorry that you are not feeling well.  Here is how we plan to help!  Based on your presentation I believe you most likely have A cough due to bacteria.  When patients have a productive cough with a change in color or increased sputum production, we are concerned about bacterial bronchitis.  If left untreated it can progress to pneumonia.  If your symptoms do not improve with your treatment plan it is important that you contact your provider.   I have prescribed Azithromyin 250 mg: two tablets now and then one tablet daily for 4 additonal days    In addition I have prescribed a short course of prednisone to use as directed if still having substantial cough and chest tightness despite use of your cough medications and albuterol inhaler.    From your responses in the eVisit questionnaire you describe inflammation in the upper respiratory tract which is causing a significant cough.  This is commonly called Bronchitis and has four common causes:   Allergies Viral Infections Acid Reflux Bacterial Infection Allergies, viruses and acid reflux are treated by controlling symptoms or eliminating the cause. An example might be a cough caused by taking certain blood pressure medications. You stop the cough by changing the medication. Another example might be a cough caused by acid reflux. Controlling the reflux helps control the cough.  USE OF BRONCHODILATOR ("RESCUE") INHALERS: There is a risk from using your bronchodilator too frequently.  The risk is that over-reliance on a medication which only relaxes the muscles surrounding the breathing tubes can reduce the effectiveness of medications prescribed to reduce swelling and congestion of the tubes themselves.  Although you feel brief relief from the bronchodilator inhaler, your asthma may actually be worsening with the tubes becoming more swollen and filled with mucus.  This can delay other crucial treatments, such as oral steroid  medications. If you need to use a bronchodilator inhaler daily, several times per day, you should discuss this with your provider.  There are probably better treatments that could be used to keep your asthma under control.     HOME CARE Only take medications as instructed by your medical team. Complete the entire course of an antibiotic. Drink plenty of fluids and get plenty of rest. Avoid close contacts especially the very young and the elderly Cover your mouth if you cough or cough into your sleeve. Always remember to wash your hands A steam or ultrasonic humidifier can help congestion.   GET HELP RIGHT AWAY IF: You develop worsening fever. You become short of breath You cough up blood. Your symptoms persist after you have completed your treatment plan MAKE SURE YOU  Understand these instructions. Will watch your condition. Will get help right away if you are not doing well or get worse.    Thank you for choosing an e-visit.  Your e-visit answers were reviewed by a board certified advanced clinical practitioner to complete your personal care plan. Depending upon the condition, your plan could have included both over the counter or prescription medications.  Please review your pharmacy choice. Make sure the pharmacy is open so you can pick up prescription now. If there is a problem, you may contact your provider through [employer] and have the prescription routed to another pharmacy.  Your safety is important to [place]. If you have drug allergies check your prescription carefully.   For the next 24 hours you can use MyChart to ask questions about today's  visit, request a non-urgent call back, or ask for a work or school excuse. You will get an email in the next two days asking about your experience. I hope that your e-visit has been valuable and will speed your recovery.

## 2023-06-27 ENCOUNTER — Other Ambulatory Visit (HOSPITAL_BASED_OUTPATIENT_CLINIC_OR_DEPARTMENT_OTHER): Payer: Self-pay

## 2023-06-27 MED ORDER — OSELTAMIVIR PHOSPHATE 75 MG PO CAPS
75.0000 mg | ORAL_CAPSULE | Freq: Every day | ORAL | 0 refills | Status: AC
  Filled 2023-06-27: qty 10, 10d supply, fill #0

## 2023-07-06 ENCOUNTER — Other Ambulatory Visit (HOSPITAL_BASED_OUTPATIENT_CLINIC_OR_DEPARTMENT_OTHER): Payer: Self-pay

## 2023-07-06 MED ORDER — CAPVAXIVE 0.5 ML IM SOSY
0.5000 mL | PREFILLED_SYRINGE | Freq: Once | INTRAMUSCULAR | 0 refills | Status: DC
  Filled 2023-07-06: qty 0.5, 1d supply, fill #0

## 2023-07-09 ENCOUNTER — Other Ambulatory Visit: Payer: Self-pay

## 2023-08-04 ENCOUNTER — Encounter: Payer: Self-pay | Admitting: Nurse Practitioner

## 2023-08-04 ENCOUNTER — Other Ambulatory Visit (HOSPITAL_BASED_OUTPATIENT_CLINIC_OR_DEPARTMENT_OTHER): Payer: Self-pay

## 2023-08-04 ENCOUNTER — Telehealth: Admitting: Nurse Practitioner

## 2023-08-04 DIAGNOSIS — U071 COVID-19: Secondary | ICD-10-CM | POA: Diagnosis not present

## 2023-08-04 MED ORDER — NIRMATRELVIR/RITONAVIR (PAXLOVID)TABLET
3.0000 | ORAL_TABLET | Freq: Two times a day (BID) | ORAL | 0 refills | Status: AC
  Filled 2023-08-04: qty 30, 5d supply, fill #0

## 2023-08-04 NOTE — Progress Notes (Signed)
 Virtual Visit Consent   Amy Odonnell, you are scheduled for a virtual visit with a [facility] provider today. Just as with appointments in the office, your consent must be obtained to participate. Your consent will be active for this visit and any virtual visit you may have with one of our providers in the next 365 days. If you have a MyChart account, a copy of this consent can be sent to you electronically.  As this is a virtual visit, video technology does not allow for your provider to perform a traditional examination. This may limit your provider's ability to fully assess your condition. If your provider identifies any concerns that need to be evaluated in person or the need to arrange testing (such as labs, EKG, etc.), we will make arrangements to do so. Although advances in technology are sophisticated, we cannot ensure that it will always work on either your end or our end. If the connection with a video visit is poor, the visit may have to be switched to a telephone visit. With either a video or telephone visit, we are not always able to ensure that we have a secure connection.  By engaging in this virtual visit, you consent to the provision of healthcare and authorize for your insurance to be billed (if applicable) for the services provided during this visit. Depending on your insurance coverage, you may receive a charge related to this service.  I need to obtain your verbal consent now. Are you willing to proceed with your visit today? Amy Odonnell has provided verbal consent on 08/04/2023 for a virtual visit (video or telephone). Amy Odonnell  Date: 08/04/2023 8:45 AM   Virtual Visit via Video Note   I, Amy Odonnell, connected with  Amy Odonnell  ([id number], 07-11-1970) on 08/04/23 at  8:45 AM EDT by a video-enabled telemedicine application and verified that I am speaking with the correct person using two identifiers.  Location: Patient: Virtual Visit Location Patient:  Mobile Provider: Virtual Visit Location Provider: Home Office   I discussed the limitations of evaluation and management by telemedicine and the availability of in person appointments. The patient expressed understanding and agreed to proceed.    History of Present Illness: Amy Odonnell is a 53 y.o. who identifies as a female who was assigned female at birth, and is being seen today for covid positive.  Amy Odonnell for [employer] has tested positive for COVID. She is currently experiencing mild symptoms of fatigue and congestions. Denies cough, shortness of breath or fever. 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 (Take nirmatrelvir 150 mg two tablets twice daily for 5 days and ritonavir 100 mg one tablet twice daily for 5 days) Patient GFR is >60 (11-8293)., Disp: 30 tablet, Rfl: 0   albuterol (VENTOLIN HFA) 108 (90 Base) MCG/ACT inhaler, Inhale 1-2 puffs into the lungs every 6 (six) hours as needed., Disp: 8  g, Rfl: 0   fluticasone (FLONASE) 50 MCG/ACT nasal spray, Place 1 spray into both nostrils daily as needed for allergies or rhinitis., Disp: , Rfl:    loratadine (CLARITIN) 10 MG tablet, Take 10 mg by mouth as needed., Disp: , Rfl:    Norethindrone Acetate-Ethinyl Estradiol (LOESTRIN) 1.5-30 MG-MCG tablet, Take 1 tablet by mouth daily. To be taken continuously., Disp: 84 tablet, Rfl: 3   predniSONE (STERAPRED UNI-PAK 21 TAB) 10 MG (21) TBPK  tablet, Take following package directions, Disp: 21 tablet, Rfl: 0   Spacer/Aero-Holding Chambers DEVI, Use with inhaler as needed, Disp: 1 each, Rfl: 0   valACYclovir (VALTREX) 500 MG tablet, Take 1 tablet (500 mg total) by mouth 2 (two) times daily. (Patient taking differently: Take 500 mg by mouth as needed.), Disp: 60 tablet, Rfl: 5  Observations/Objective: Patient is well-developed, well-nourished in no acute distress.  Resting comfortably  Head is normocephalic, atraumatic.  No labored breathing.  Speech is clear and coherent with logical content.  Patient is alert and oriented at baseline.    Assessment and Plan: 1. Positive self-administered antigen test for COVID-19 (Primary) - nirmatrelvir/ritonavir (PAXLOVID) 20 x 150 MG & 10 x 100MG  TABS; Take 3 tablets by mouth 2 (two) times daily for 5 days. (Take nirmatrelvir 150 mg two tablets twice daily for 5 days and ritonavir 100 mg one tablet twice daily for 5 days) Patient GFR is >60 (11-8293).  Dispense: 30 tablet; Refill: 0 We discussed renal dose vs regular dose Paxlovid. I recommended renal dose as we do not have a recent creatinine on file since 2023. Amy. Odonnell who is an [employer] Odonnell is aware that she does not have a recent creatinine on file since 2023 and states she currently has normal kidney function and requests to have the regular dose Paxlovid sent to pharmacy. She is aware of the risks of taking regular dose paxlovid in the light of having renal disease.   Follow Up Instructions: I discussed the assessment and treatment plan with the patient. The patient was provided an opportunity to ask questions and all were answered. The patient agreed with the plan and demonstrated an understanding of the instructions.  A copy of instructions were sent to the patient via MyChart unless otherwise noted below.    The patient was advised to call back or seek an in-person evaluation if the symptoms worsen or if the condition  fails to improve as anticipated.    Amy Odonnell

## 2023-08-04 NOTE — Patient Instructions (Signed)
 Rica Mast, thank you for joining [name], NP for today's virtual visit.  While this provider is not your primary care provider (PCP), if your PCP is located in our provider database this encounter information will be shared with them immediately following your visit.   A [facility] MyChart account gives you access to today's visit and all your visits, tests, and labs performed at [facility] " click here if you don't have a [place] MyChart account or go to [email or web address]  Consent: (Patient) Amy Odonnell provided verbal consent for this virtual visit at the beginning of the encounter.  Current Medications:  Current Outpatient Medications:    nirmatrelvir/ritonavir (PAXLOVID) 20 x 150 MG & 10 x 100MG  TABS, Take 3 tablets by mouth 2 (two) times daily for 5 days. (Take nirmatrelvir 150 mg two tablets twice daily for 5 days and ritonavir 100 mg one tablet twice daily for 5 days) Patient GFR is >60 (06-1306)., Disp: 30 tablet, Rfl: 0   albuterol (VENTOLIN HFA) 108 (90 Base) MCG/ACT inhaler, Inhale 1-2 puffs into the lungs every 6 (six) hours as needed., Disp: 8 g, Rfl: 0   fluticasone (FLONASE) 50 MCG/ACT nasal spray, Place 1 spray into both nostrils daily as needed for allergies or rhinitis., Disp: , Rfl:    loratadine (CLARITIN) 10 MG tablet, Take 10 mg by mouth as needed., Disp: , Rfl:    Norethindrone Acetate-Ethinyl Estradiol (LOESTRIN) 1.5-30 MG-MCG tablet, Take 1 tablet by mouth daily. To be taken continuously., Disp: 84 tablet, Rfl: 3   predniSONE (STERAPRED UNI-PAK 21 TAB) 10 MG (21) TBPK tablet, Take following package directions, Disp: 21 tablet, Rfl: 0   Spacer/Aero-Holding Chambers DEVI, Use with inhaler as needed, Disp: 1 each, Rfl: 0   valACYclovir (VALTREX) 500 MG tablet, Take 1 tablet (500 mg total) by mouth 2 (two) times daily. (Patient taking differently: Take 500 mg by mouth as needed.), Disp: 60 tablet, Rfl: 5   Medications ordered in this  encounter:  Meds ordered this encounter  Medications   nirmatrelvir/ritonavir (PAXLOVID) 20 x 150 MG & 10 x 100MG  TABS    Sig: Take 3 tablets by mouth 2 (two) times daily for 5 days. (Take nirmatrelvir 150 mg two tablets twice daily for 5 days and ritonavir 100 mg one tablet twice daily for 5 days) Patient GFR is >60 (10-5782).    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 Discussed renal dose paxlovid due to no recent creatinine on file and she is aware. States current kidney function is normal and she accepts risks of taking regular strength paxlovid. She is requesting regular dose paxlovid and declines renal dose paxlovid.    Supervising Provider:   Merrilee Jansky [[id number]]     *If you need refills on other medications prior to your next appointment, please contact your pharmacy*  Follow-Up: Call back or seek an in-person evaluation if the symptoms worsen or if the condition fails to improve as anticipated.  [facility] [phone]    If you have been instructed to have an in-person evaluation today at a local Urgent Care facility, please use the link below. It will take you to a list of all of our available [facility], including address, phone number and hours of operation. Please do not delay care.  [facility]  If you or a family member do not have a primary care provider,  use the link below to schedule a visit and establish care. When you choose a [facility] primary care physician or advanced practice provider, you gain a long-term partner in health. Find a Primary Care Provider  Learn more about [facility]'s in-office and virtual care options: [facility] - Get Care Now

## 2023-08-23 ENCOUNTER — Other Ambulatory Visit (HOSPITAL_BASED_OUTPATIENT_CLINIC_OR_DEPARTMENT_OTHER): Payer: Self-pay

## 2023-08-23 MED ORDER — CAPVAXIVE 0.5 ML IM SOSY
0.5000 mL | PREFILLED_SYRINGE | Freq: Once | INTRAMUSCULAR | 0 refills | Status: AC
  Filled 2023-08-23: qty 0.5, 1d supply, fill #0

## 2023-11-28 ENCOUNTER — Telehealth: Payer: Self-pay | Admitting: Family Medicine

## 2023-11-28 NOTE — Telephone Encounter (Signed)
 Noted and agreed, thank you.

## 2023-11-28 NOTE — Telephone Encounter (Signed)
 TOC from Amy Odonnell has been scheduled. Please advise.

## 2024-01-07 ENCOUNTER — Encounter: Payer: Self-pay | Admitting: Physician Assistant

## 2024-01-07 ENCOUNTER — Other Ambulatory Visit (HOSPITAL_BASED_OUTPATIENT_CLINIC_OR_DEPARTMENT_OTHER): Payer: Self-pay

## 2024-01-07 ENCOUNTER — Ambulatory Visit: Admitting: Physician Assistant

## 2024-01-07 VITALS — BP 108/58 | HR 105 | Temp 98.4°F | Ht 67.5 in | Wt 195.2 lb

## 2024-01-07 DIAGNOSIS — N939 Abnormal uterine and vaginal bleeding, unspecified: Secondary | ICD-10-CM | POA: Diagnosis not present

## 2024-01-07 DIAGNOSIS — J4520 Mild intermittent asthma, uncomplicated: Secondary | ICD-10-CM | POA: Diagnosis not present

## 2024-01-07 DIAGNOSIS — Z9889 Other specified postprocedural states: Secondary | ICD-10-CM | POA: Diagnosis not present

## 2024-01-07 DIAGNOSIS — Z8719 Personal history of other diseases of the digestive system: Secondary | ICD-10-CM

## 2024-01-07 DIAGNOSIS — H40013 Open angle with borderline findings, low risk, bilateral: Secondary | ICD-10-CM | POA: Diagnosis not present

## 2024-01-07 MED ORDER — FLUTICASONE PROPIONATE HFA 44 MCG/ACT IN AERO
2.0000 | INHALATION_SPRAY | Freq: Two times a day (BID) | RESPIRATORY_TRACT | 5 refills | Status: AC
  Filled 2024-01-07: qty 10.6, 30d supply, fill #0
  Filled 2024-03-24: qty 10.6, 30d supply, fill #1

## 2024-01-07 NOTE — Progress Notes (Signed)
 Patient ID: Amy Odonnell, female    DOB: 1970/10/11, 53 y.o.   MRN: [id number]   Assessment & Plan:  Mild intermittent asthma without complication  Other orders -     Fluticasone  Propionate HFA; Inhale 2 puffs into the lungs 2 (two) times daily. Rinse mouth after use.  Dispense: 10.6 g; Refill: 5      Assessment and Plan Assessment & Plan Asthma with persistent post-infectious cough and mucus production Mild exercise-induced asthma since childhood, rarely requiring an inhaler. Recent pneumonia twice in the past year with persistent mucus production post-infection. No current wheezing or bronchoconstriction, suggesting post-infectious changes rather than allergen-related issues. - Initiate low-dose Flovent  to address mucus production - Consider montelukast for allergen-related symptoms if needed - No need for rescue inhaler at this time  Perimenopausal abnormal uterine bleeding Recent heavy menstrual bleeding following light spotting. History of irregular and heavy periods managed with birth control. No current hot flashes or other menopausal symptoms. Prefers monitoring before further intervention. - Monitor menstrual cycle for recurrence of heavy bleeding - Consider restarting birth control if heavy bleeding recurs - Referral to gynecology if symptoms worsen or become unmanageable  History of large rectal adenomatous polyp, status post removal Large adenomatous rectal polyp removed two years ago. Follow-up colonoscopy last October showed no recurrence. Scheduled for surveillance colonoscopy every three to five years. - Continue surveillance colonoscopy as per GI recommendations      Return in about 8 months (around 09/06/2024) for physical, fasting labs .    Subjective:    Chief Complaint  Patient presents with   New Patient (Initial Visit)    New pt in office to est care with PCP; pt was former [facility] pt; pt would like to discuss her asthma while est care, had  pneumonia past two years, and still gunky during vacation higher elevation really struggled, would like to try Flovent  low dose for a bit; not using albuterol  daily    HPI Discussed the use of AI scribe software for clinical note transcription with the patient, who gave verbal consent to proceed.  History of Present Illness Amy Odonnell is a 53 year old female who presents for a routine check-up and to discuss persistent respiratory symptoms post-pneumonia.  She has a history of mild exercise-induced asthma since childhood, which rarely requires the use of an inhaler. She experienced pneumonia twice, once this past winter and once a year prior, both resolving with antibiotics. Since the last episode, she continues to experience persistent mucus production without wheezing. She received the pneumonia vaccine following her recent episodes of pneumonia. She does not typically require a rescue inhaler and has not experienced exacerbations that necessitate prednisone . She has not tried Airsupra, a combination of albuterol  and budesonide, as she does not generally need rescue medication.  She reports a significant menstrual history, with irregular and heavy periods leading to the use of birth control in the past to manage symptoms. Recently, she experienced an extremely heavy period after a period of light spotting, which she described as 'like my body saved up periods for a year and tried to give it to me.' She is considering returning to birth control if such episodes recur.  Her family history includes her mother with newly diagnosed diabetes and her father with polymyalgia rheumatica. Her maternal grandmother passed away at an advanced age without a specific cause. She has one sister with depression. She has two adopted children, a daughter and a son, both in good health.  She  had an adenomatous rectal polyp removed two years ago after experiencing symptoms. She is on a follow-up schedule for  colonoscopies every three to five years, with the last one conducted last October.     Past Medical History:  Diagnosis Date   Allergy    SEASONAL   Asthma    Migraine    Pneumonia    Seasonal allergies     Past Surgical History:  Procedure Laterality Date   APPENDECTOMY     COLONOSCOPY     LAPAROSCOPIC PARTIAL PROTECTOMY N/A 01/04/2022   Procedure: ROBOTIC TAMIS PARTIAL PROTECTOMY OF RECTAL MASS;  Surgeon: Sheldon Standing, MD;  Location: [facility];  Service: General;  Laterality: N/A;    Family History  Problem Relation Age of Onset   Asthma Mother    Hypothyroidism Mother    Obesity Mother    Depression Mother    Arthritis Mother    Diabetes Mother    Hyperlipidemia Father    Other Father        hyperglycemia   Polymyalgia rheumatica Father    Depression Sister    Diabetes Maternal Grandmother    Hypertension Maternal Grandmother    Colon cancer Neg Hx    Colon polyps Neg Hx    Crohn's disease Neg Hx    Esophageal cancer Neg Hx    Rectal cancer Neg Hx    Stomach cancer Neg Hx    Breast cancer Neg Hx    Ulcerative colitis Neg Hx     Social History   Tobacco Use   Smoking status: Never    Passive exposure: Never   Smokeless tobacco: Never  Vaping Use   Vaping status: Never Used  Substance Use Topics   Alcohol use: Not Currently   Drug use: No     Allergies  Allergen Reactions   Codeine Hives    Review of Systems NEGATIVE UNLESS OTHERWISE INDICATED IN HPI      Objective:     BP (!) 108/58 (BP Location: Left Arm, Patient Position: Sitting, Cuff Size: Normal)   Pulse (!) 105   Temp 98.4 F (36.9 C) (Temporal)   Ht 5' 7.5 (1.715 m)   Wt 195 lb 3.2 oz (88.5 kg)   LMP 12/31/2023 (Exact Date)   SpO2 98%   BMI 30.12 kg/m   Wt Readings from Last 3 Encounters:  01/07/24 195 lb 3.2 oz (88.5 kg)  03/06/23 185 lb (83.9 kg)  02/13/23 185 lb (83.9 kg)    BP Readings from Last 3 Encounters:  01/07/24 (!) 108/58  03/06/23 109/67  12/18/22 110/78      Physical Exam          Janmichael Giraud M Chanique Duca, PA-C

## 2024-01-08 NOTE — Patient Instructions (Signed)
 Welcome to [facility]! It was a pleasure meeting you today.   PLEASE NOTE:  If you had any LAB tests please let us know if you have not heard back within a few days. You may see your results on MyChart before we have a chance to review them but we will give you a call once they are reviewed by [place]. If we ordered any REFERRALS today, please let us know if you have not heard from their office within the next two weeks. Let us know through MyChart if you are needing REFILLS, or have your pharmacy send [place] the request. You can also use MyChart to communicate with me or any office staff.  Please try these tips to maintain a healthy lifestyle:  Eat most of your calories during the day when you are active. Eliminate processed foods including packaged sweets (pies, cakes, cookies), reduce intake of potatoes, white bread, white pasta, and white rice. Look for whole grain options, oat flour or almond flour.  Each meal should contain half fruits/vegetables, one quarter protein, and one quarter carbs (no bigger than a computer mouse).  Cut down on sweet beverages. This includes juice, soda, and sweet tea. Also watch fruit intake, though this is a healthier sweet option, it still contains natural sugar! Limit to 3 servings daily.  Drink at least 1 glass of water with each meal and aim for at least 8 glasses (64 ounces) per day.  Exercise at least 150 minutes every week to the best of your ability.    Take Care,  Audel Coakley, PA-C

## 2024-01-20 ENCOUNTER — Encounter: Payer: Self-pay | Admitting: Physician Assistant

## 2024-01-22 ENCOUNTER — Other Ambulatory Visit: Payer: Self-pay

## 2024-01-22 ENCOUNTER — Other Ambulatory Visit (HOSPITAL_BASED_OUTPATIENT_CLINIC_OR_DEPARTMENT_OTHER): Payer: Self-pay

## 2024-01-22 DIAGNOSIS — N951 Menopausal and female climacteric states: Secondary | ICD-10-CM

## 2024-01-22 MED ORDER — NORETHINDRONE ACET-ETHINYL EST 1.5-30 MG-MCG PO TABS
1.0000 | ORAL_TABLET | Freq: Every day | ORAL | 3 refills | Status: AC
  Filled 2024-01-22: qty 84, 63d supply, fill #0
  Filled 2024-03-24: qty 84, 63d supply, fill #1

## 2024-01-22 NOTE — Telephone Encounter (Signed)
 Please see pt msg and advise

## 2024-02-19 IMAGING — MR MR PELVIS W/O CM
9 series · 48 of 48 positions shown · non-contrast
Comparison: CT evaluation [REDACTED] of 7776.

CLINICAL DATA: Rectal cancer, large broad-based polyp in the
proximal rectum. Dans adenoma.

EXAM:
MRI PELVIS WITHOUT CONTRAST
TECHNIQUE: Multiplanar multisequence MR imaging of the pelvis was performed. No
intravenous contrast was administered. Ultrasound gel was
administered per rectum to optimize tumor evaluation.

[Series 3: T2 · sagittal · 3.0mm · 1.19mm/px · 4 of 35 slices shown (1 of 6)]
[im 1/35]
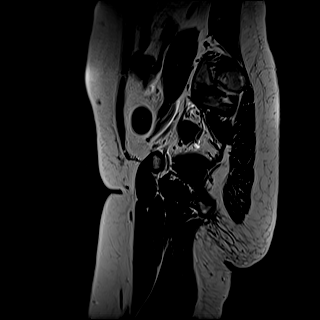
[im 12/35]
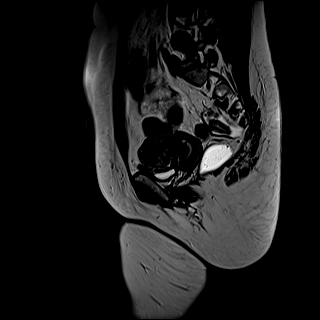
[im 23/35]
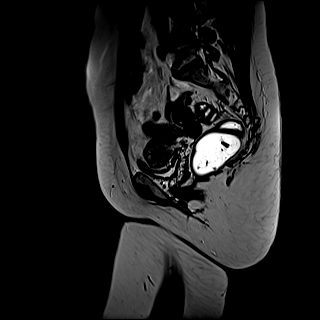
[im 35/35]
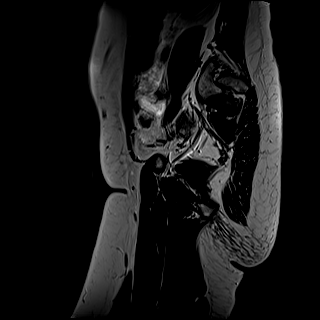

[Series 4: T2 · axial · 5.0mm · 1.19mm/px · z∈[-107,+109]mm · 4 of 37 slices shown (2 of 6)]
[im 1/37]
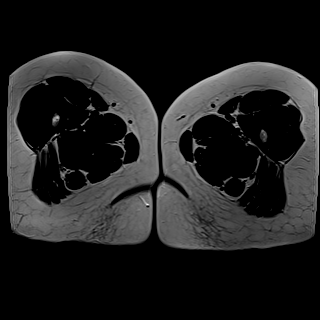
[im 13/37]
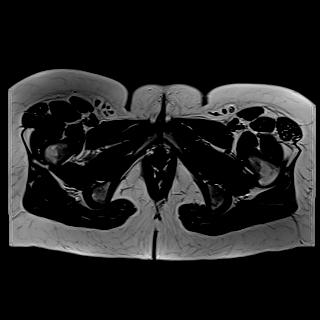
[im 25/37]
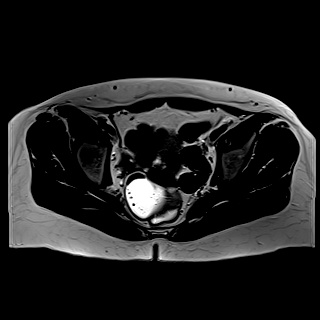
[im 37/37]
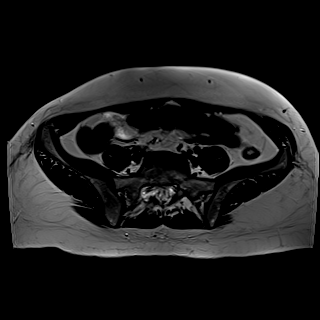

[Series 5: T2 · coronal · 3.0mm · 0.56mm/px · 5 of 45 slices shown (3 of 6)]
[im 1/45]
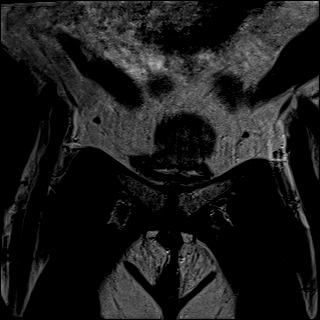
[im 12/45]
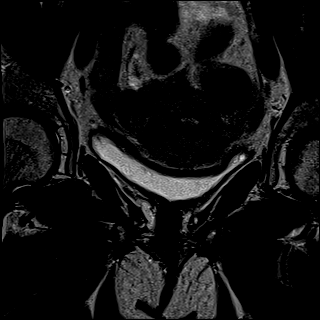
[im 23/45]
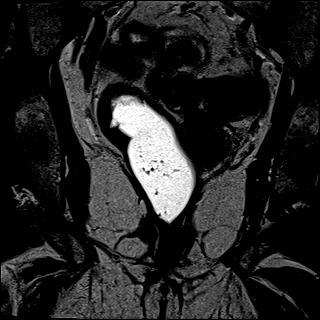
[im 34/45]
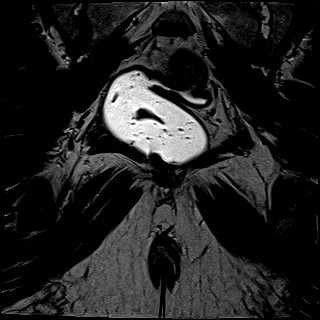
[im 45/45]
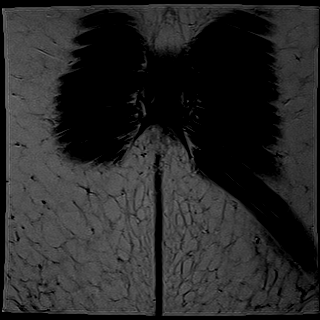

[Series 6: ax dwi_tracew · axial · 5.0mm · 1.17mm/px · z∈[-107,+109]mm · 12 of 111 slices shown]
[im 1/111]
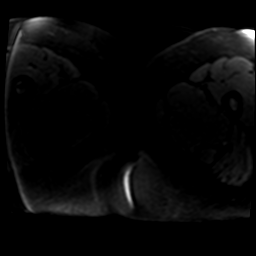
[im 11/111]
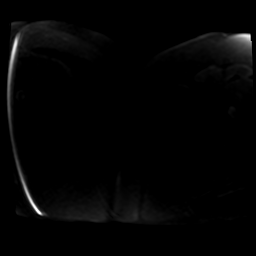
[im 21/111]
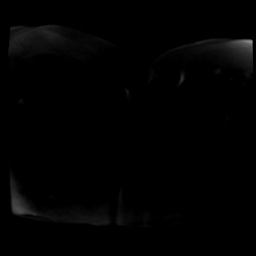
[im 31/111]
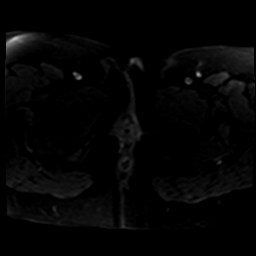
[im 41/111]
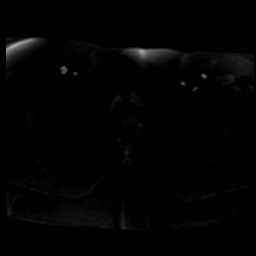
[im 51/111]
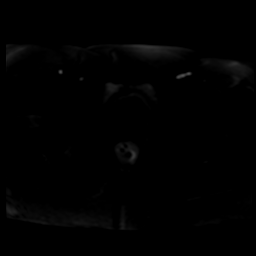
[im 61/111]
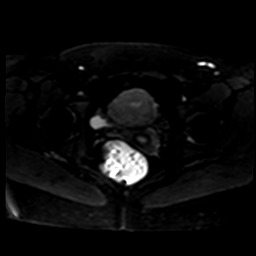
[im 71/111]
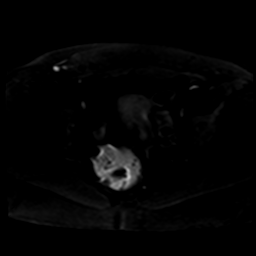
[im 81/111]
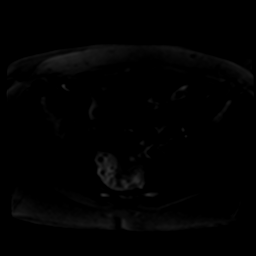
[im 91/111]
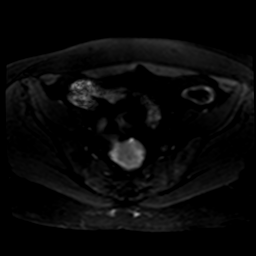
[im 101/111]
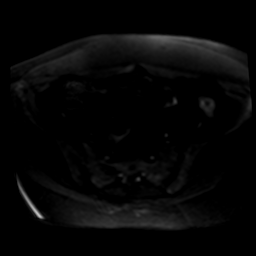
[im 111/111]
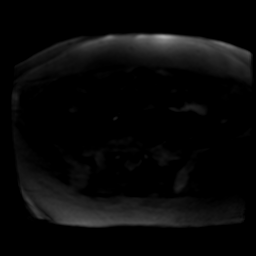

[Series 7: ax dwi_adc · axial · 5.0mm · 1.17mm/px · z∈[-107,+109]mm · 4 of 37 slices shown]
[im 1/37]
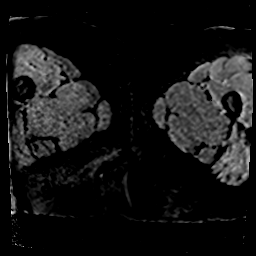
[im 13/37]
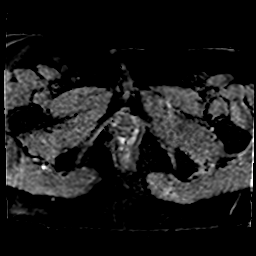
[im 25/37]
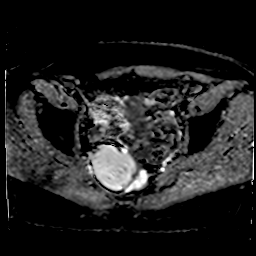
[im 37/37]
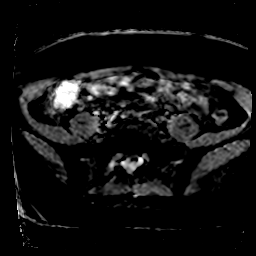

[Series 8: ax dwi_calc_bval · axial · 5.0mm · 1.17mm/px · z∈[-107,+109]mm · 4 of 37 slices shown]
[im 1/37]
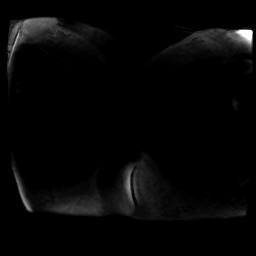
[im 13/37]
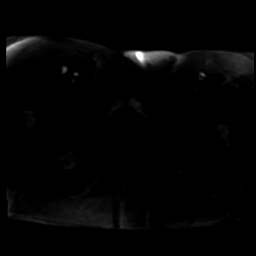
[im 25/37]
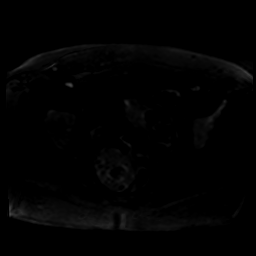
[im 37/37]
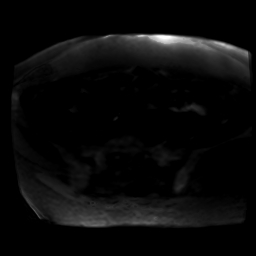

[Series 9: T2 · oblique · 3.0mm · 0.56mm/px · 5 of 45 slices shown (4 of 6)]
[im 1/45]
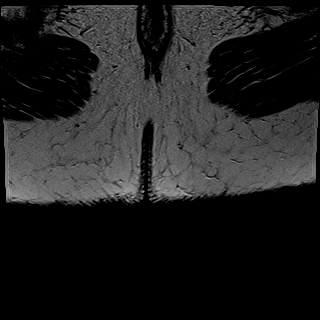
[im 12/45]
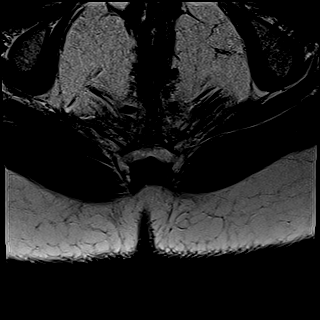
[im 23/45]
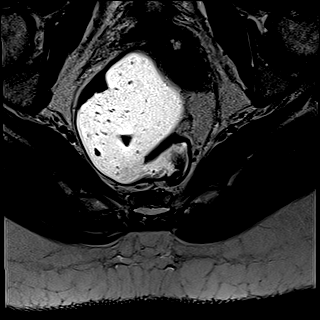
[im 34/45]
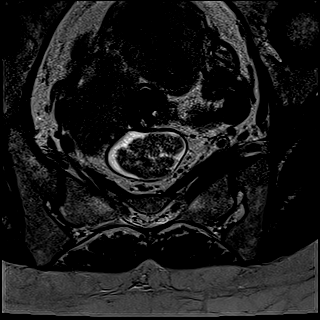
[im 45/45]
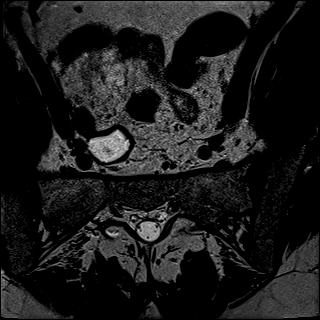

[Series 10: T2 · oblique · 3.0mm · 0.56mm/px · 5 of 45 slices shown (5 of 6)]
[im 1/45]
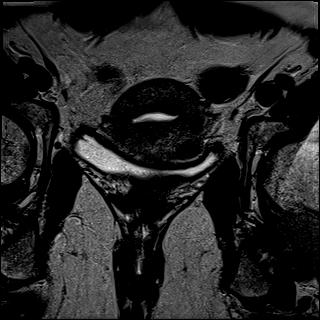
[im 12/45]
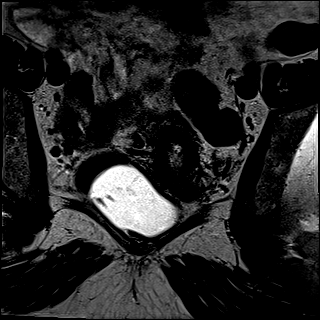
[im 23/45]
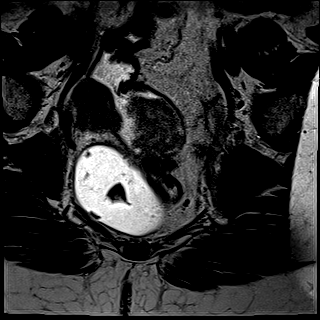
[im 34/45]
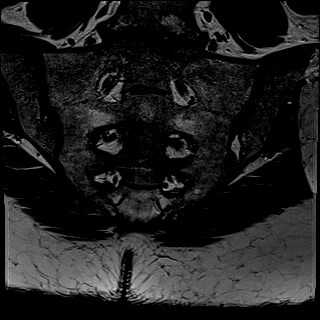
[im 45/45]
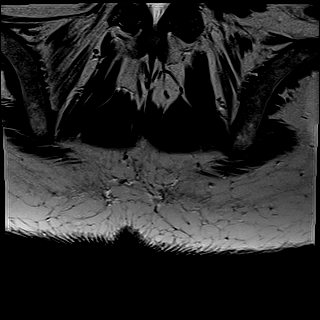

[Series 11: T2 · oblique · 3.0mm · 0.56mm/px · 5 of 45 slices shown (6 of 6)]
[im 1/45]
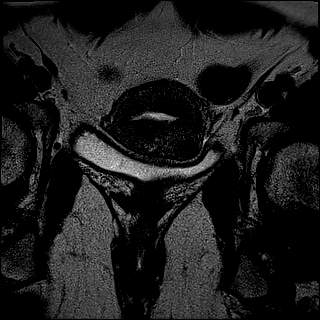
[im 12/45]
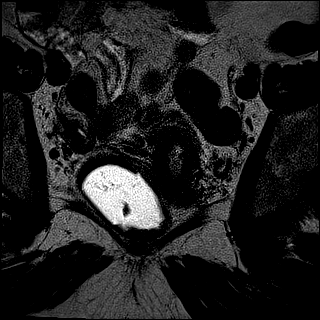
[im 23/45]
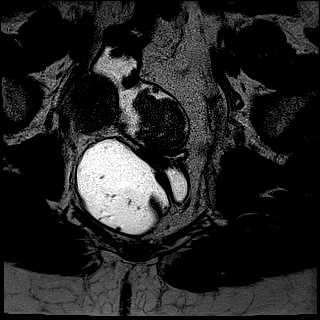
[im 34/45]
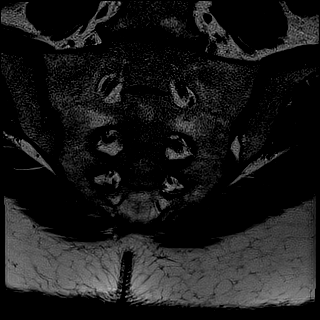
[im 45/45]
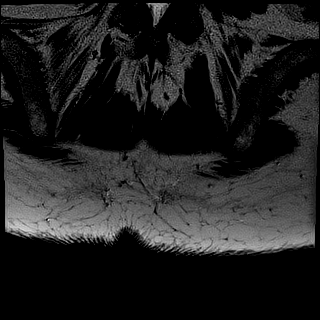

[48 of 48 positions shown; findings below may reference images not displayed]

FINDINGS: TUMOR LOCATION

Tumor distance from Anal Verge/Skin Surface:  12 cm

Tumor distance to Internal Anal Sphincter: 9 cm

TUMOR DESCRIPTION

Circumferential Extent: Sessile appearing tumor in the high rectum
arises from posterior rectum extending into the lumen.

Tumor Length: 4.5 cm

T - CATEGORY

Extension through Muscularis Propria: No

Shortest Distance of any tumor/node from Mesorectal Fascia:
Difficult to determine due to tortuosity of the rectum. Tumor is
near the level of the APR.

Extramural Vascular Invasion/Tumor Thrombus: No

Invasion of Anterior Peritoneal Reflection: No

Involvement of Adjacent Organs or Pelvic Sidewall: No

Levator Ani Involvement: No

N - CATEGORY

Mesorectal Lymph Nodes >=5mm: None

Extra-mesorectal Lymphadenopathy: None

Other:  None
IMPRESSION: High rectal tumor, T stage: T2

N stage:  N0

Distance from tumor to the internal anal sphincter is 12 cm.

## 2024-02-29 ENCOUNTER — Other Ambulatory Visit (HOSPITAL_BASED_OUTPATIENT_CLINIC_OR_DEPARTMENT_OTHER): Payer: Self-pay

## 2024-02-29 MED ORDER — COMIRNATY 30 MCG/0.3ML IM SUSY
0.3000 mL | PREFILLED_SYRINGE | Freq: Once | INTRAMUSCULAR | 0 refills | Status: AC
  Filled 2024-02-29: qty 0.3, 1d supply, fill #0

## 2024-02-29 MED ORDER — FLUZONE 0.5 ML IM SUSY
0.5000 mL | PREFILLED_SYRINGE | Freq: Once | INTRAMUSCULAR | 0 refills | Status: AC
Start: 2024-02-29 — End: 2024-03-01
  Filled 2024-02-29: qty 0.5, 1d supply, fill #0

## 2024-05-21 ENCOUNTER — Other Ambulatory Visit: Payer: Self-pay | Admitting: Physician Assistant

## 2024-05-21 DIAGNOSIS — Z1231 Encounter for screening mammogram for malignant neoplasm of breast: Secondary | ICD-10-CM

## 2024-06-12 ENCOUNTER — Ambulatory Visit
Admission: RE | Admit: 2024-06-12 | Discharge: 2024-06-12 | Disposition: A | Discharge status: Home / Self Care | Source: Ambulatory Visit | Attending: Physician Assistant | Admitting: Physician Assistant

## 2024-06-12 DIAGNOSIS — Z1231 Encounter for screening mammogram for malignant neoplasm of breast: Secondary | ICD-10-CM

## 2024-11-03 ENCOUNTER — Encounter: Admitting: Physician Assistant
